# Patient Record
Sex: Male | Born: 1965 | Race: White | Hispanic: No | Marital: Married | Smoking: Never smoker
Health system: Southern US, Community
[De-identification: ages and names within clinical notes are randomized; demographics above are authoritative.]

## PROBLEM LIST (undated history)

## (undated) DIAGNOSIS — N189 Chronic kidney disease, unspecified: Secondary | ICD-10-CM

## (undated) DIAGNOSIS — K219 Gastro-esophageal reflux disease without esophagitis: Secondary | ICD-10-CM

## (undated) DIAGNOSIS — R7989 Other specified abnormal findings of blood chemistry: Secondary | ICD-10-CM

## (undated) DIAGNOSIS — R740 Nonspecific elevation of levels of transaminase and lactic acid dehydrogenase [LDH]: Secondary | ICD-10-CM

## (undated) DIAGNOSIS — G4733 Obstructive sleep apnea (adult) (pediatric): Secondary | ICD-10-CM

## (undated) DIAGNOSIS — E119 Type 2 diabetes mellitus without complications: Secondary | ICD-10-CM

## (undated) DIAGNOSIS — I10 Essential (primary) hypertension: Secondary | ICD-10-CM

## (undated) DIAGNOSIS — R0600 Dyspnea, unspecified: Secondary | ICD-10-CM

## (undated) DIAGNOSIS — R7401 Elevation of levels of liver transaminase levels: Secondary | ICD-10-CM

## (undated) DIAGNOSIS — E785 Hyperlipidemia, unspecified: Secondary | ICD-10-CM

## (undated) DIAGNOSIS — R12 Heartburn: Secondary | ICD-10-CM

## (undated) DIAGNOSIS — R7402 Elevation of levels of lactic acid dehydrogenase (LDH): Secondary | ICD-10-CM

## (undated) DIAGNOSIS — R0609 Other forms of dyspnea: Secondary | ICD-10-CM

## (undated) DIAGNOSIS — R7301 Impaired fasting glucose: Secondary | ICD-10-CM

## (undated) HISTORY — DX: Elevation of levels of liver transaminase levels: R74.01

## (undated) HISTORY — DX: Other specified abnormal findings of blood chemistry: R79.89

## (undated) HISTORY — DX: Nonspecific elevation of levels of transaminase and lactic acid dehydrogenase (ldh): R74.0

## (undated) HISTORY — DX: Impaired fasting glucose: R73.01

## (undated) HISTORY — DX: Essential (primary) hypertension: I10

## (undated) HISTORY — DX: Obstructive sleep apnea (adult) (pediatric): G47.33

## (undated) HISTORY — DX: Hyperlipidemia, unspecified: E78.5

## (undated) HISTORY — DX: Chronic kidney disease, unspecified: N18.9

## (undated) HISTORY — PX: OTHER SURGICAL HISTORY: SHX169

## (undated) HISTORY — DX: Elevation of levels of lactic acid dehydrogenase (LDH): R74.02

## (undated) HISTORY — DX: Heartburn: R12

---

## 2004-12-27 ENCOUNTER — Emergency Department: Payer: Self-pay | Admitting: Emergency Medicine

## 2006-08-29 IMAGING — CR DG ABDOMEN 1V
1 series · 1 of 1 positions shown · non-contrast
Comparison: none

REASON FOR EXAM: Abdominal pain, pt in rm 4
COMMENTS:

[view not recorded]
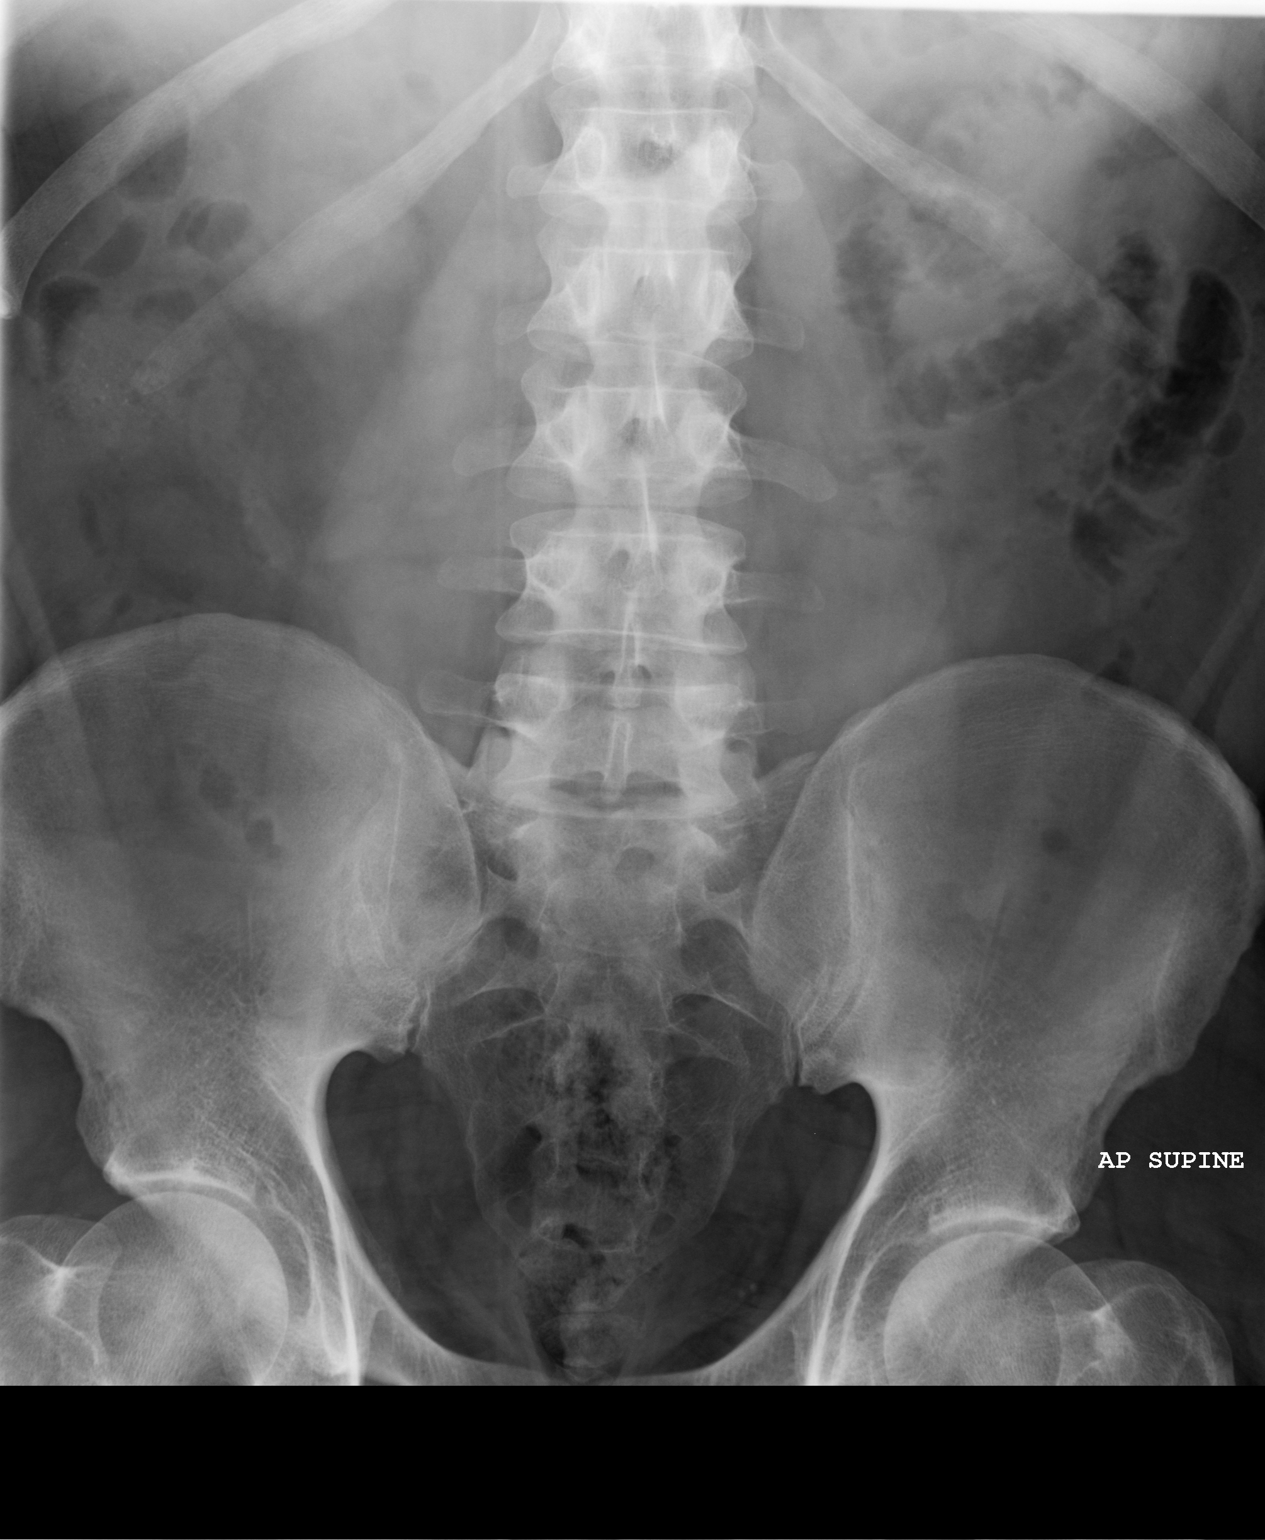

[1 of 1 positions shown; findings below may reference images not displayed]

PROCEDURE:     DXR - DXR KIDNEY URETER BLADDER  - December 27, 2004  [DATE]

RESULT:         AP view of the abdomen shows a normal bowel gas pattern.  No
abnormal intra-abdominal calcifications are seen.  A few tiny phleboliths
are noted in the pelvis.  The psoas margins are visualized bilaterally.  The
osseous structures are normal in appearance.
IMPRESSION: No acute changes are identified.

## 2010-06-20 ENCOUNTER — Emergency Department: Payer: Self-pay | Admitting: Unknown Physician Specialty

## 2012-01-17 HISTORY — DX: Rider (driver) (passenger) of other motorcycle injured in unspecified traffic accident, initial encounter: V29.99XA

## 2012-01-17 HISTORY — PX: OTHER SURGICAL HISTORY: SHX169

## 2012-07-17 DIAGNOSIS — S32000A Wedge compression fracture of unspecified lumbar vertebra, initial encounter for closed fracture: Secondary | ICD-10-CM | POA: Insufficient documentation

## 2012-07-17 DIAGNOSIS — S06309A Unspecified focal traumatic brain injury with loss of consciousness of unspecified duration, initial encounter: Secondary | ICD-10-CM | POA: Insufficient documentation

## 2012-07-18 DIAGNOSIS — J9811 Atelectasis: Secondary | ICD-10-CM | POA: Insufficient documentation

## 2012-07-18 DIAGNOSIS — S42102A Fracture of unspecified part of scapula, left shoulder, initial encounter for closed fracture: Secondary | ICD-10-CM | POA: Insufficient documentation

## 2012-07-18 DIAGNOSIS — T797XXA Traumatic subcutaneous emphysema, initial encounter: Secondary | ICD-10-CM | POA: Insufficient documentation

## 2012-07-18 DIAGNOSIS — E872 Acidosis: Secondary | ICD-10-CM | POA: Insufficient documentation

## 2012-07-18 DIAGNOSIS — J95821 Acute postprocedural respiratory failure: Secondary | ICD-10-CM | POA: Insufficient documentation

## 2012-07-18 DIAGNOSIS — S27322A Contusion of lung, bilateral, initial encounter: Secondary | ICD-10-CM | POA: Insufficient documentation

## 2012-07-18 DIAGNOSIS — S2249XA Multiple fractures of ribs, unspecified side, initial encounter for closed fracture: Secondary | ICD-10-CM | POA: Insufficient documentation

## 2012-07-23 DIAGNOSIS — S062X9A Diffuse traumatic brain injury with loss of consciousness of unspecified duration, initial encounter: Secondary | ICD-10-CM | POA: Insufficient documentation

## 2012-07-29 DIAGNOSIS — J189 Pneumonia, unspecified organism: Secondary | ICD-10-CM | POA: Insufficient documentation

## 2012-07-29 DIAGNOSIS — N390 Urinary tract infection, site not specified: Secondary | ICD-10-CM | POA: Insufficient documentation

## 2013-06-18 DIAGNOSIS — K219 Gastro-esophageal reflux disease without esophagitis: Secondary | ICD-10-CM | POA: Insufficient documentation

## 2013-06-19 DIAGNOSIS — I493 Ventricular premature depolarization: Secondary | ICD-10-CM | POA: Insufficient documentation

## 2014-06-11 DIAGNOSIS — I10 Essential (primary) hypertension: Secondary | ICD-10-CM | POA: Insufficient documentation

## 2014-07-09 ENCOUNTER — Telehealth: Payer: Self-pay | Admitting: Unknown Physician Specialty

## 2014-07-09 NOTE — Telephone Encounter (Signed)
Patient has a different number in practice partner- [phone]. Did you try that one?

## 2014-07-09 NOTE — Telephone Encounter (Signed)
I attempted to call the pt to reschedule his appt and the number we have on file has been disconnected. I have mailed a letter to the patient to call and reschedule. Thanks.

## 2014-07-23 DIAGNOSIS — I129 Hypertensive chronic kidney disease with stage 1 through stage 4 chronic kidney disease, or unspecified chronic kidney disease: Secondary | ICD-10-CM | POA: Insufficient documentation

## 2014-07-23 DIAGNOSIS — E785 Hyperlipidemia, unspecified: Secondary | ICD-10-CM | POA: Insufficient documentation

## 2014-07-23 DIAGNOSIS — Z713 Dietary counseling and surveillance: Secondary | ICD-10-CM | POA: Insufficient documentation

## 2014-07-23 DIAGNOSIS — E669 Obesity, unspecified: Secondary | ICD-10-CM

## 2014-07-23 DIAGNOSIS — R740 Nonspecific elevation of levels of transaminase and lactic acid dehydrogenase [LDH]: Secondary | ICD-10-CM

## 2014-07-23 DIAGNOSIS — R7402 Elevation of levels of lactic acid dehydrogenase (LDH): Secondary | ICD-10-CM | POA: Insufficient documentation

## 2014-07-23 DIAGNOSIS — M779 Enthesopathy, unspecified: Secondary | ICD-10-CM | POA: Insufficient documentation

## 2014-07-23 DIAGNOSIS — R7301 Impaired fasting glucose: Secondary | ICD-10-CM

## 2014-07-23 DIAGNOSIS — R7401 Elevation of levels of liver transaminase levels: Secondary | ICD-10-CM | POA: Insufficient documentation

## 2014-07-23 DIAGNOSIS — N182 Chronic kidney disease, stage 2 (mild): Secondary | ICD-10-CM | POA: Insufficient documentation

## 2014-07-23 DIAGNOSIS — I10 Essential (primary) hypertension: Secondary | ICD-10-CM | POA: Insufficient documentation

## 2014-07-23 DIAGNOSIS — G4733 Obstructive sleep apnea (adult) (pediatric): Secondary | ICD-10-CM | POA: Insufficient documentation

## 2014-07-24 ENCOUNTER — Ambulatory Visit: Payer: Self-pay | Admitting: Unknown Physician Specialty

## 2014-08-03 ENCOUNTER — Encounter: Payer: Self-pay | Admitting: Unknown Physician Specialty

## 2014-08-03 ENCOUNTER — Ambulatory Visit (INDEPENDENT_AMBULATORY_CARE_PROVIDER_SITE_OTHER): Payer: 59 | Admitting: Unknown Physician Specialty

## 2014-08-03 VITALS — BP 140/80 | HR 98 | Temp 99.0°F | Ht 70.0 in | Wt 280.0 lb

## 2014-08-03 DIAGNOSIS — I10 Essential (primary) hypertension: Secondary | ICD-10-CM | POA: Diagnosis not present

## 2014-08-03 MED ORDER — BENAZEPRIL HCL 10 MG PO TABS: 10.0000 mg | ORAL_TABLET | Freq: Every day | ORAL | Status: DC

## 2014-08-03 NOTE — Progress Notes (Signed)
   BP 140/80 mmHg  Pulse 98  Temp(Src) 99 F (37.2 C)  Ht 5\' 10"  (1.778 m)  Wt 280 lb (127.007 kg)  BMI 40.18 kg/m2  SpO2 99%   Subjective:    Patient ID: Andrew Mata, male    DOB: February 09, 1965, 49 y.o.   MRN: [id number]  HPI: Andrew Mata is a 49 y.o. male  Chief Complaint  Patient presents with  . Hypertension   Hypertension This is a chronic problem. Progression since onset: stable. The problem is controlled. Pertinent negatives include no anxiety, chest pain, palpitations, shortness of breath or sweats. There are no compliance problems.  There is no history of angina.    Relevant past medical, surgical, family and social history reviewed and updated as indicated. Interim medical history since our last visit reviewed. Allergies and medications reviewed and updated.  Review of Systems  Respiratory: Negative for shortness of breath.   Cardiovascular: Negative for chest pain and palpitations.    Per HPI unless specifically indicated above     Objective:    BP 140/80 mmHg  Pulse 98  Temp(Src) 99 F (37.2 C)  Ht 5\' 10"  (1.778 m)  Wt 280 lb (127.007 kg)  BMI 40.18 kg/m2  SpO2 99%  Wt Readings from Last 3 Encounters:  08/03/14 280 lb (127.007 kg)  01/23/14 271 lb (122.925 kg)    Second BP reading 140/80  Physical Exam  Constitutional: He is oriented to person, place, and time. He appears well-developed and well-nourished. No distress.  HENT:  Head: Normocephalic and atraumatic.  Eyes: Conjunctivae and lids are normal. Right eye exhibits no discharge. Left eye exhibits no discharge. No scleral icterus.  Cardiovascular: Normal rate, regular rhythm and normal heart sounds.   Pulmonary/Chest: Effort normal and breath sounds normal. No respiratory distress.  Abdominal: Normal appearance. There is no splenomegaly or hepatomegaly.  Musculoskeletal: Normal range of motion.  Neurological: He is alert and oriented to person, place, and time.  Skin: Skin is intact. No  rash noted. No pallor.  Psychiatric: He has a normal mood and affect. His behavior is normal. Judgment and thought content normal.    No results found for this or any previous visit.    Assessment & Plan:   Problem List Items Addressed This Visit      Cardiovascular and Mediastinum   Hypertension - Primary    BP is stable.  Refill present medications.        Relevant Medications   benazepril (LOTENSIN) 10 MG tablet       Follow up plan: Return in about 6 months (around 02/03/2015) for for PE.

## 2014-08-03 NOTE — Assessment & Plan Note (Signed)
BP is stable.  Refill present medications.

## 2014-08-12 ENCOUNTER — Other Ambulatory Visit: Payer: Self-pay | Admitting: Unknown Physician Specialty

## 2014-08-12 MED ORDER — BENAZEPRIL HCL 10 MG PO TABS: 10.0000 mg | ORAL_TABLET | Freq: Every day | ORAL | Status: DC

## 2014-08-12 NOTE — Telephone Encounter (Signed)
E-fax came through for refill on: Rx: benazepril (LOTENSIN) 10 MG tablet Copy in basket.

## 2014-08-12 NOTE — Telephone Encounter (Signed)
This medication was just refilled for this patient on 08/03/14 because he was seen then. But the rx was sent to [facility] and he needed it to be sent to [facility] because his insurance requires him to do mail order. So please resend rx to [facility].

## 2014-11-07 ENCOUNTER — Ambulatory Visit
Admission: EM | Admit: 2014-11-07 | Discharge: 2014-11-07 | Disposition: A | Discharge status: Home / Self Care | Payer: 59 | Attending: Emergency Medicine | Admitting: Emergency Medicine

## 2014-11-07 ENCOUNTER — Ambulatory Visit (INDEPENDENT_AMBULATORY_CARE_PROVIDER_SITE_OTHER): Payer: 59

## 2014-11-07 DIAGNOSIS — S62309A Unspecified fracture of unspecified metacarpal bone, initial encounter for closed fracture: Secondary | ICD-10-CM | POA: Diagnosis not present

## 2014-11-07 MED ORDER — HYDROCODONE-ACETAMINOPHEN 5-325 MG PO TABS: 2.0000 | ORAL_TABLET | ORAL | Status: DC | PRN

## 2014-11-07 NOTE — ED Provider Notes (Signed)
HPI  SUBJECTIVE:  Andrew Mata is a left handed  49 y.o. male who presents with pain, swelling along the lateral aspect of his right hand after falling off a tractor, hitting it on the tractor one week ago. He reports shooting, intermittent pain, limitation of motion secondary to pain. No redness, numbness, tingling, other injury to the wrist, arm, elbow. He denies hitting his head or loss of consciousness. Symptoms are better with ice, blood pressure on his palm, Aleve. Worse with use of palpation. He has not tried anything else for this. Past medical history of hypertension. Patient denies history of chronic kidney disease No history of diabetes, acid reflux, osteoporosis, tobacco use.  Past Medical History  Diagnosis Date  . Hypertension   . Obstructive sleep apnea   . Hyperlipidemia   . Chronic kidney disease   . Nonspecific elevation of levels of transaminase or lactic acid dehydrogenase (LDH)   . IFG (impaired fasting glucose)     History reviewed. No pertinent past surgical history.  Family History  Problem Relation Age of Onset  . Hypertension Mother   . Lung disease Father   . Hypertension Brother   . Hypertension Maternal Grandfather   . Hypertension Brother     Social History  Substance Use Topics  . Smoking status: Never Smoker   . Smokeless tobacco: Former Systems developer    Types: Chew  . Alcohol Use: 0.0 oz/week    0 Standard drinks or equivalent per week     Comment: one or less per day    No current facility-administered medications for this encounter.  Current outpatient prescriptions:  .  benazepril (LOTENSIN) 10 MG tablet, Take 1 tablet (10 mg total) by mouth daily., Disp: 90 tablet, Rfl: 1 .  omeprazole (PRILOSEC) 10 MG capsule, Take 10 mg by mouth daily., Disp: , Rfl:  .  HYDROcodone-acetaminophen (NORCO/VICODIN) 5-325 MG tablet, Take 2 tablets by mouth every 4 (four) hours as needed for moderate pain., Disp: 20 tablet, Rfl: 0  No Known  Allergies   ROS  As noted in HPI.   Physical Exam  BP 156/95 mmHg  Pulse 84  Temp(Src) 98 F (36.7 C) (Oral)  Resp 18  Ht 5\' 11"  (1.803 m)  Wt 288 lb (130.636 kg)  BMI 40.19 kg/m2  SpO2 100%  Constitutional: Well developed, well nourished, no acute distress Eyes:  EOMI, conjunctiva normal bilaterally HENT: Normocephalic, atraumatic,mucus membranes moist Respiratory: Normal inspiratory effort Cardiovascular: Normal rate GI: nondistended skin: No rash, skin intact Musculoskeletal: Tenderness of the distal right fifth metacarpal, soft tissue swelling. Tenderness at the MCP joint. No tenderness along the proximal middle or distal phalanx. No other tenderness along all the other carpals and metacarpals and phalanges. Baseline Strength and Sensation to R hand with normal light touch intact for Pt, distal motor and sensation in median/radial/ulnar nerve distribution .  Skin intact. No bruising. Wrist WNL.  Neurologic: Alert & oriented x 3, no focal neuro deficits Psychiatric: Speech and behavior appropriate   ED Course   Medications - No data to display  Orders Placed This Encounter  Procedures  . DG Hand Complete Right    Standing Status: Standing     Number of Occurrences: 1     Standing Expiration Date:     Order Specific Question:  Reason for Exam (SYMPTOM  OR DIAGNOSIS REQUIRED)    Answer:  pain/swelling after fall  . Ambulatory referral to Hand Surgery    Referral Priority:  Urgent  Referral Type:  Surgical    Referral Reason:  Specialty Services Required    Requested Specialty:  Hand Surgery    Number of Visits Requested:  1  . Apply other splint    Standing Status: Standing     Number of Occurrences: 1     Standing Expiration Date:     Order Specific Question:  Laterality    Answer:  Right    Order Specific Question:  Splint type    Answer:  Ulnar Gutter    No results found for this or any previous visit (from the past 24 hour(s)). Dg Hand Complete  Right  11/07/2014  CLINICAL DATA:  Fall 1 week ago. Pain involving the small finger. Swelling. Initial encounter. EXAM: RIGHT HAND - COMPLETE 3+ VIEW COMPARISON:  None. FINDINGS: There is a predominantly transverse fracture through the distal shaft of the fifth metacarpal with mild radial displacement and palmar angulation and slight comminution. No dislocation is seen. There is overlying soft tissue swelling. No radiopaque foreign body or lytic or blastic osseous lesion. IMPRESSION: Mildly displaced and angulated fifth metacarpal fracture. Electronically Signed   By: Logan Bores M.D.   On: 11/07/2014 11:05    ED Clinical Impression  Metacarpal bone fracture, closed, initial encounter   ED Assessment/Plan  reviewed imaging independently. Transverse fracture through distal shaft fifth metacarpal with radial displacement, palmar angulation, slight comminution. No dislocation. Soft tissue swelling. See radiology report for details  Patient is neurovascularly intact. Placing an ulnar gutter splint. We'll refer to orthopedics or hand for follow-up within several days. Ordered f/u . Home with ice, elevation., Norco and DC NSAIDs.  Discussed imaging, MDM, plan and followup with patient . Discussed sn/sx that should prompt return to the UC or ED. Patient agrees with plan.   *This clinic note was created using Dragon dictation software. Therefore, there may be occasional mistakes despite careful proofreading.  ?   Melynda Ripple, MD 11/07/14 1200

## 2014-11-07 NOTE — Discharge Instructions (Signed)
Follow-up with orthopedics or one of the hand surgeons. Call their office to try to make an appointment. Tell them that you were referred from urgent care. I have also placed a referral. Continue the ice, elevation. Wear the splint to protect it and for comfort. Go to the ER for any change in color your fingers, numbness, if pain is not controlled with medications or other concerns

## 2014-12-22 ENCOUNTER — Other Ambulatory Visit: Payer: Self-pay | Admitting: Unknown Physician Specialty

## 2015-01-29 ENCOUNTER — Encounter: Payer: 59 | Admitting: Unknown Physician Specialty

## 2015-02-22 ENCOUNTER — Telehealth: Payer: Self-pay

## 2015-02-22 ENCOUNTER — Encounter: Payer: Self-pay | Admitting: Unknown Physician Specialty

## 2015-02-22 ENCOUNTER — Ambulatory Visit (INDEPENDENT_AMBULATORY_CARE_PROVIDER_SITE_OTHER): Payer: 59 | Admitting: Unknown Physician Specialty

## 2015-02-22 VITALS — BP 126/84 | HR 109 | Temp 98.3°F | Ht 68.7 in | Wt 287.6 lb

## 2015-02-22 DIAGNOSIS — E669 Obesity, unspecified: Secondary | ICD-10-CM | POA: Diagnosis not present

## 2015-02-22 DIAGNOSIS — R6882 Decreased libido: Secondary | ICD-10-CM | POA: Diagnosis not present

## 2015-02-22 DIAGNOSIS — Z0000 Encounter for general adult medical examination without abnormal findings: Secondary | ICD-10-CM

## 2015-02-22 DIAGNOSIS — I10 Essential (primary) hypertension: Secondary | ICD-10-CM | POA: Diagnosis not present

## 2015-02-22 NOTE — Assessment & Plan Note (Signed)
Discussed diet and exercise 

## 2015-02-22 NOTE — Assessment & Plan Note (Signed)
Stable, continue present medications.   

## 2015-02-22 NOTE — Telephone Encounter (Signed)
Patient is here for visit and stated he got his flu shot at [facility]. So I called them and they stated that the patient got his flu shot 10/24/14.

## 2015-02-22 NOTE — Progress Notes (Signed)
BP 126/84 mmHg  Pulse 109  Temp(Src) 98.3 F (36.8 C)  Ht 5' 8.7" (1.745 m)  Wt 287 lb 9.6 oz (130.455 kg)  BMI 42.84 kg/m2  SpO2 97%   Subjective:    Patient ID: Andrew Mata, male    DOB: Apr 19, 1965, 50 y.o.   MRN: [id number]  HPI: Andrew Mata is a 50 y.o. male  Chief Complaint  Patient presents with  . Annual Exam   Patient is interested in getting testosterone level checked due to lower sex drive. No erection issues.    Hypertension Using medications without difficulty Average home BPs: doesn't take   No problems or lightheadedness No chest pain with exertion or shortness of breath No Edema  GERD Controlled with current medication  Obesity Tries to go to the gym daily and makes it regular some weeks and not so regular other weeks.  Doesn't eat fried foods.  Dirinks diet drinks.    Relevant past medical, surgical, family and social history reviewed and updated as indicated. Interim medical history since our last visit reviewed. Allergies and medications reviewed and updated.  Review of Systems  Constitutional: Negative.   HENT: Negative.   Eyes: Negative.   Respiratory: Negative.   Cardiovascular: Negative.   Gastrointestinal: Negative.   Endocrine: Negative.   Genitourinary: Negative.   Skin: Negative.   Allergic/Immunologic: Negative.   Neurological: Negative.   Hematological: Negative.   Psychiatric/Behavioral: Negative.     Per HPI unless specifically indicated above     Objective:    BP 126/84 mmHg  Pulse 109  Temp(Src) 98.3 F (36.8 C)  Ht 5' 8.7" (1.745 m)  Wt 287 lb 9.6 oz (130.455 kg)  BMI 42.84 kg/m2  SpO2 97%  Wt Readings from Last 3 Encounters:  02/22/15 287 lb 9.6 oz (130.455 kg)  11/07/14 288 lb (130.636 kg)  08/03/14 280 lb (127.007 kg)    Physical Exam  Constitutional: He is oriented to person, place, and time. He appears well-developed and well-nourished.  HENT:  Head: Normocephalic.  Right Ear: Tympanic membrane,  external ear and ear canal normal.  Left Ear: Tympanic membrane, external ear and ear canal normal.  Mouth/Throat: Uvula is midline, oropharynx is clear and moist and mucous membranes are normal.  Eyes: Pupils are equal, round, and reactive to light.  Cardiovascular: Normal rate, regular rhythm and normal heart sounds.  Exam reveals no gallop and no friction rub.   No murmur heard. Pulmonary/Chest: Effort normal and breath sounds normal. No respiratory distress.  Abdominal: Soft. Bowel sounds are normal. He exhibits no distension. There is no tenderness.  Musculoskeletal: Normal range of motion.  Neurological: He is alert and oriented to person, place, and time. He has normal reflexes.  Skin: Skin is warm and dry.  Psychiatric: He has a normal mood and affect. His behavior is normal. Judgment and thought content normal.    No results found for this or any previous visit.    Assessment & Plan:   Problem List Items Addressed This Visit      Unprioritized   Hypertension    Stable, continue present medications.        Obesity    Discussed diet and exercise      Low libido   Relevant Orders   Testosterone, Free, Total, SHBG    Other Visit Diagnoses    Annual physical exam    -  Primary    Relevant Orders    CBC    Comprehensive metabolic  panel    HIV antibody    Lipid Panel w/o Chol/HDL Ratio    PSA        Follow up plan: Return in about 6 months (around 08/22/2015).

## 2015-02-25 ENCOUNTER — Other Ambulatory Visit: Payer: 59

## 2015-02-25 DIAGNOSIS — R6882 Decreased libido: Secondary | ICD-10-CM

## 2015-02-25 DIAGNOSIS — Z0000 Encounter for general adult medical examination without abnormal findings: Secondary | ICD-10-CM

## 2015-02-26 ENCOUNTER — Telehealth: Payer: Self-pay

## 2015-02-26 DIAGNOSIS — E291 Testicular hypofunction: Secondary | ICD-10-CM

## 2015-02-26 NOTE — Telephone Encounter (Signed)
Discussed with pt Testosterone levels.  We will refer to Urology for further management.  Discussed cholesterol but not in statin benefit group.

## 2015-02-26 NOTE — Telephone Encounter (Signed)
I did not call this patient. Malachy Mood did you?

## 2015-02-26 NOTE — Telephone Encounter (Signed)
-----   Message from Stark Klein sent at 02/26/2015  4:43 PM EST ----- Contact: [phone] Pt return call

## 2015-02-27 LAB — LIPID PANEL W/O CHOL/HDL RATIO
CHOLESTEROL TOTAL: 214 mg/dL — AB (ref 100–199)
HDL: 49 mg/dL (ref >39)
LDL CALC: 134 mg/dL — AB (ref 0–99)
TRIGLYCERIDES: 154 mg/dL — AB (ref 0–149)
VLDL CHOLESTEROL CAL: 31 mg/dL (ref 5–40)

## 2015-02-27 LAB — COMPREHENSIVE METABOLIC PANEL
ALT: 25 IU/L (ref 0–44)
AST: 19 IU/L (ref 0–40)
Albumin/Globulin Ratio: 1.5 (ref 1.1–2.5)
Albumin: 4.3 g/dL (ref 3.5–5.5)
Alkaline Phosphatase: 65 IU/L (ref 39–117)
BUN/Creatinine Ratio: 17 (ref 9–20)
BUN: 19 mg/dL (ref 6–24)
Bilirubin Total: 0.4 mg/dL (ref 0.0–1.2)
CALCIUM: 9.3 mg/dL (ref 8.7–10.2)
CO2: 25 mmol/L (ref 18–29)
CREATININE: 1.1 mg/dL (ref 0.76–1.27)
Chloride: 99 mmol/L (ref 96–106)
GFR calc Af Amer: 90 mL/min/{1.73_m2} (ref >59)
GFR, EST NON AFRICAN AMERICAN: 78 mL/min/{1.73_m2} (ref >59)
GLOBULIN, TOTAL: 2.8 g/dL (ref 1.5–4.5)
GLUCOSE: 117 mg/dL — AB (ref 65–99)
Potassium: 5.2 mmol/L (ref 3.5–5.2)
Sodium: 139 mmol/L (ref 134–144)
Total Protein: 7.1 g/dL (ref 6.0–8.5)

## 2015-02-27 LAB — HIV ANTIBODY (ROUTINE TESTING W REFLEX): HIV SCREEN 4TH GENERATION: NONREACTIVE

## 2015-02-27 LAB — CBC
HEMATOCRIT: 44.3 % (ref 37.5–51.0)
Hemoglobin: 14.5 g/dL (ref 12.6–17.7)
MCH: 28.4 pg (ref 26.6–33.0)
MCHC: 32.7 g/dL (ref 31.5–35.7)
MCV: 87 fL (ref 79–97)
PLATELETS: 215 10*3/uL (ref 150–379)
RBC: 5.1 x10E6/uL (ref 4.14–5.80)
RDW: 15 % (ref 12.3–15.4)
WBC: 5.8 10*3/uL (ref 3.4–10.8)

## 2015-02-27 LAB — TESTOSTERONE, FREE, TOTAL, SHBG
Sex Hormone Binding: 50.9 nmol/L (ref 19.3–76.4)
Testosterone, Free: 5 pg/mL — ABNORMAL LOW (ref 7.2–24.0)
Testosterone: 220 ng/dL — ABNORMAL LOW (ref 348–1197)

## 2015-02-27 LAB — PSA: Prostate Specific Ag, Serum: 0.5 ng/mL (ref 0.0–4.0)

## 2015-03-05 ENCOUNTER — Ambulatory Visit (INDEPENDENT_AMBULATORY_CARE_PROVIDER_SITE_OTHER): Payer: 59 | Admitting: Urology

## 2015-03-05 ENCOUNTER — Encounter: Payer: Self-pay | Admitting: Urology

## 2015-03-05 VITALS — BP 131/86 | HR 85 | Ht 69.0 in | Wt 293.8 lb

## 2015-03-05 DIAGNOSIS — N528 Other male erectile dysfunction: Secondary | ICD-10-CM | POA: Diagnosis not present

## 2015-03-05 DIAGNOSIS — N529 Male erectile dysfunction, unspecified: Secondary | ICD-10-CM

## 2015-03-05 DIAGNOSIS — N401 Enlarged prostate with lower urinary tract symptoms: Secondary | ICD-10-CM

## 2015-03-05 DIAGNOSIS — E291 Testicular hypofunction: Secondary | ICD-10-CM | POA: Diagnosis not present

## 2015-03-05 DIAGNOSIS — N138 Other obstructive and reflux uropathy: Secondary | ICD-10-CM | POA: Insufficient documentation

## 2015-03-05 DIAGNOSIS — E349 Endocrine disorder, unspecified: Secondary | ICD-10-CM | POA: Insufficient documentation

## 2015-03-05 NOTE — Progress Notes (Signed)
03/05/2015 11:59 AM   Azell Der 07/12/1965 [id number]  Referring provider: Kathrine Haddock, NP [place], [place] [address]  Chief Complaint  Patient presents with  . Hypogonadism    Referred by Kathrine Haddock    HPI: Patient is a 50 year old Caucasian male who was found to have a low testosterone value on 02/25/2015. He is referred to [place] by his primary care provider, Kathrine Haddock, NP, for further evaluation and management.  Patient stated that he noted his sex drive over the last 5-7 years has declined and this prompted him to ask for his testosterone to be checked.  His testosterone returned at  220 ng/dL on 02/25/2015.  Hypogonadism Patient is experiencing a decrease in libido, a lack of energy, a decrease in strength, a loss in height, a decreased enjoyment in life, sadness and/or grumpiness and a recent deterioration in an ability to play sports.  This is indicated by his responses to the ADAM questionnaire.       Androgen Deficiency in the Aging Male      03/05/15 1100       Androgen Deficiency in the Aging Male   Do you have a decrease in libido (sex drive) Yes     Do you have lack of energy Yes     Do you have a decrease in strength and/or endurance Yes     Have you lost height Yes     Have you noticed a decreased "enjoyment of life" Yes     Are you sad and/or grumpy Yes     Are your erections less strong No     Have you noticed a recent deterioration in your ability to play sports Yes     Are you falling asleep after dinner No     Has there been a recent deterioration in your work performance No        Patient does have sleep apnea and is not sleeping with his CPAP machine.  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    Erectile dysfunction His SHIM score is 25, which is no erectile dysfunction.   His libido is diminished.   His risk factors for ED are obesity, BPH, sleep apnea,  HTN, CKD and IFG.  He denies any painful erections or curvatures with his erections.       SHIM      03/05/15 1126       SHIM: Over the last 6 months:   How do you rate your confidence that you could get and keep an erection? Very High     When you had erections with sexual stimulation, how often were your erections hard enough for penetration (entering your partner)? Almost Always or Always     During sexual intercourse, how often were you able to maintain your erection after you had penetrated (entered) your partner? Not Difficult     During sexual intercourse, how difficult was it to maintain your erection to completion of intercourse? Not Difficult     When you attempted sexual intercourse, how often was it satisfactory for you? Not Difficult     SHIM Total Score   SHIM 25        Score: 1-7 Severe ED 8-11 Moderate ED 12-16 Mild-Moderate ED 17-21 Mild ED 22-25 No ED   BPH WITH LUTS His IPSS score today is 1, which is mild lower urinary  tract symptomatology. He is delighted with his quality life due to his urinary symptoms. He denies any dysuria, hematuria or suprapubic pain.  He also denies any recent fevers, chills, nausea or vomiting.  He does not have a family history of PCa.      IPSS      03/05/15 1100       International Prostate Symptom Score   How often have you had the sensation of not emptying your bladder? Not at All     How often have you had to urinate less than every two hours? Not at All     How often have you found you stopped and started again several times when you urinated? Not at All     How often have you found it difficult to postpone urination? Not at All     How often have you had a weak urinary stream? Not at All     How often have you had to strain to start urination? Not at All     How many times did you typically get up at night to urinate? 1 Time     Total IPSS Score 1     Quality of Life due to urinary symptoms   If you were to spend the  rest of your life with your urinary condition just the way it is now how would you feel about that? Delighted        Score:  1-7 Mild 8-19 Moderate 20-35 Severe   PMH: Past Medical History  Diagnosis Date  . Hypertension   . Obstructive sleep apnea   . Hyperlipidemia   . Chronic kidney disease   . Nonspecific elevation of levels of transaminase or lactic acid dehydrogenase (LDH)   . IFG (impaired fasting glucose)   . Heart burn   . Low testosterone     Surgical History: Past Surgical History  Procedure Laterality Date  . Tracheotomy    . Lung collapse      Home Medications:    Medication List       This list is accurate as of: 03/05/15 11:59 AM.  Always use your most recent med list.               benazepril 10 MG tablet  Commonly known as:  LOTENSIN  Take 1 tablet (10 mg total) by mouth daily.     omeprazole 10 MG capsule  Commonly known as:  PRILOSEC  Take 10 mg by mouth daily.        Allergies: No Known Allergies  Family History: Family History  Problem Relation Age of Onset  . Hypertension Mother   . Lung disease Father   . Hypertension Brother   . Hypertension Maternal Grandfather   . Hypertension Brother   . Kidney cancer Mother   . Prostate cancer Paternal Grandfather   . Lung cancer Mother     Social History:  reports that he has never smoked. He has quit using smokeless tobacco. His smokeless tobacco use included Chew. He reports that he drinks alcohol. He reports that he does not use illicit drugs.  ROS: UROLOGY Frequent Urination?: No Hard to postpone urination?: No Burning/pain with urination?: No Get up at night to urinate?: Yes Leakage of urine?: No Urine stream starts and stops?: No Trouble starting stream?: No Do you have to strain to urinate?: No Blood in urine?: No Urinary tract infection?: No Sexually transmitted disease?: No Injury to kidneys or bladder?: No Painful intercourse?: No Weak  stream?: No Erection  problems?: No Penile pain?: No  Gastrointestinal Nausea?: No Vomiting?: No Indigestion/heartburn?: No Diarrhea?: No Constipation?: No  Constitutional Fever: No Night sweats?: No Weight loss?: No Fatigue?: Yes  Skin Skin rash/lesions?: No Itching?: No  Eyes Blurred vision?: No Double vision?: No  Ears/Nose/Throat Sore throat?: No Sinus problems?: No  Hematologic/Lymphatic Swollen glands?: No Easy bruising?: No  Cardiovascular Leg swelling?: No Chest pain?: No  Respiratory Cough?: No Shortness of breath?: Yes  Endocrine Excessive thirst?: No  Musculoskeletal Back pain?: Yes Joint pain?: Yes  Neurological Headaches?: No Dizziness?: Yes  Psychologic Depression?: Yes Anxiety?: Yes  Physical Exam: BP 131/86 mmHg  Pulse 85  Ht 5\' 9"  (1.753 m)  Wt 293 lb 12.8 oz (133.267 kg)  BMI 43.37 kg/m2  Constitutional: Well nourished. Alert and oriented, No acute distress. HEENT: [place] AT, moist mucus membranes. Trachea midline, no masses. Cardiovascular: No clubbing, cyanosis, or edema. Respiratory: Normal respiratory effort, no increased work of breathing. GI: Abdomen is soft, non tender, non distended, no abdominal masses. Liver and spleen not palpable.  No hernias appreciated.  Stool sample for occult testing is not indicated.   GU: No CVA tenderness.  No bladder fullness or masses.  Patient with circumcised phallus.   Urethral meatus is patent.  No penile discharge. No penile lesions or rashes. Scrotum without lesions, cysts, rashes and/or edema.  Testicles are located scrotally bilaterally. No masses are appreciated in the testicles. Left and right epididymis are normal. Rectal: Patient with  normal sphincter tone. Anus and perineum without scarring or rashes. No rectal masses are appreciated. Prostate is approximately 45 grams, no nodules are appreciated. Seminal vesicles are normal. Skin: No rashes, bruises or suspicious lesions. Lymph: No cervical or inguinal  adenopathy. Neurologic: Grossly intact, no focal deficits, moving all 4 extremities. Psychiatric: Normal mood and affect.  Laboratory Data: Lab Results  Component Value Date   WBC 5.8 02/25/2015   HCT 44.3 02/25/2015   MCV 87 02/25/2015   PLT 215 02/25/2015    Lab Results  Component Value Date   CREATININE 1.10 02/25/2015    PSA History  0.5 ng/mL on 02/25/2015  Lab Results  Component Value Date   TESTOSTERONE 220* 02/25/2015       Component Value Date/Time   CHOL 214* 02/25/2015 0809   HDL 49 02/25/2015 0809   LDLCALC 134* 02/25/2015 0809    Lab Results  Component Value Date   AST 19 02/25/2015   Lab Results  Component Value Date   ALT 25 02/25/2015      Assessment & Plan:    1. Hypogonadism:   I explained to patient that hypogonadism is diagnosed after 2 morning serum testosterone draws before 9 AM three days apart returned below the laboratories parameter for normal testosterone. He has completed his first blood draw.  I discussed with the patient the side effects of testosterone therapy, such as: enlargement of the prostate gland that may in turn cause LUTS, possible increased risk of PCa, DVT's and/or PE's, possible increased risk of heart attack or stroke, lower sperm count, swelling of the ankles, feet, or body, with or without heart failure, enlarged or painful breasts, have problems breathing while you sleep (sleep apnea), increased prostate specific antigen, mood swings, hypertension and increased red blood cell count.  I also discussed that some men have had success using clomid for hypogonadism.  It does seem to be more successful in younger men, but there are incidences of good results in middle-aged men.  I explained that it is used in male infertility to stimulate the testicles to make more testosterone/sperm.  There has been no long term data on side effects, but some urologists has been having success with this medication.   He is very interested in  Clomid.  His LFT's are within normal parameters.  He will return next week before 9 AM for his second serum testosterone draw. I will also add a TSH, prolactin, FSH/LH and an estradiol level.  If his second testosterone returns low, we will start Clomid 50 mg, 1/2 tablet daily, #30.  He will then recheck his testosterone level before 9AM in one month.    2. BPH with LUTS:   IPSS score is 1/0.  We will continue to monitor.  He will have his IPSS score, exam and PSA repeated in 6 months.    3. Erectile dysfunction:   SHIM score is 25.  His sexual function issues are most likely due to his lack of libido.  We will reassess when he has had his testosterone levels returned to normal.    Return for next week for morning labs.  These notes generated with voice recognition software. I apologize for typographical errors.  Zara Council, [facility] [address] [place], [place] [address] [phone]

## 2015-03-08 ENCOUNTER — Other Ambulatory Visit: Payer: 59

## 2015-03-08 DIAGNOSIS — E291 Testicular hypofunction: Secondary | ICD-10-CM

## 2015-03-09 ENCOUNTER — Other Ambulatory Visit: Payer: Self-pay | Admitting: Urology

## 2015-03-09 DIAGNOSIS — E291 Testicular hypofunction: Secondary | ICD-10-CM

## 2015-03-09 LAB — PROLACTIN: Prolactin: 15.6 ng/mL — ABNORMAL HIGH (ref 4.0–15.2)

## 2015-03-09 LAB — TESTOSTERONE: TESTOSTERONE: 212 ng/dL — AB (ref 348–1197)

## 2015-03-09 LAB — ESTRADIOL: Estradiol: 23.1 pg/mL (ref 7.6–42.6)

## 2015-03-09 LAB — TSH: TSH: 2.97 u[IU]/mL (ref 0.450–4.500)

## 2015-03-09 LAB — FSH/LH
FSH: 21.3 m[IU]/mL — ABNORMAL HIGH (ref 1.5–12.4)
LH: 15.6 m[IU]/mL — ABNORMAL HIGH (ref 1.7–8.6)

## 2015-03-09 MED ORDER — CLOMIPHENE CITRATE 50 MG PO TABS: ORAL_TABLET | ORAL | Status: DC

## 2015-03-10 ENCOUNTER — Telehealth: Payer: Self-pay

## 2015-03-10 DIAGNOSIS — E291 Testicular hypofunction: Secondary | ICD-10-CM

## 2015-03-10 NOTE — Telephone Encounter (Signed)
-----   Message from Nori Riis, PA-C sent at 03/09/2015  1:01 PM EST ----- Second testosterone is low.  I will send in the prescription for Clomid to his pharmacy.  He will need to return in 1 month for a morning testosterone level and a repeated LH/FSH and prolactin before 9 AM.

## 2015-03-10 NOTE — Telephone Encounter (Signed)
Spoke with pt in reference to testosterone. Made aware Clomid is at pharmacy and labs were needed in a month. Lab appt made. Orders placed.

## 2015-04-13 ENCOUNTER — Other Ambulatory Visit: Payer: 59

## 2015-04-13 DIAGNOSIS — E291 Testicular hypofunction: Secondary | ICD-10-CM

## 2015-04-14 ENCOUNTER — Telehealth: Payer: Self-pay

## 2015-04-14 DIAGNOSIS — E291 Testicular hypofunction: Secondary | ICD-10-CM

## 2015-04-14 LAB — TESTOSTERONE: TESTOSTERONE: 453 ng/dL (ref 348–1197)

## 2015-04-14 LAB — FSH/LH
FSH: 31.8 m[IU]/mL — ABNORMAL HIGH (ref 1.5–12.4)
LH: 24.4 m[IU]/mL — ABNORMAL HIGH (ref 1.7–8.6)

## 2015-04-14 LAB — PROLACTIN: PROLACTIN: 12.9 ng/mL (ref 4.0–15.2)

## 2015-04-14 NOTE — Telephone Encounter (Signed)
-----   Message from Nori Riis, PA-C sent at 04/14/2015  8:17 AM EDT ----- His testosterone level is normal.  He will need a HCT and testosterone in one month.

## 2015-04-14 NOTE — Telephone Encounter (Signed)
LMOM

## 2015-04-15 NOTE — Telephone Encounter (Signed)
Spoke with pt in reference to lab results and needing future labs. Lab appt was made. Orders placed.

## 2015-05-17 ENCOUNTER — Other Ambulatory Visit: Payer: 59

## 2015-05-17 DIAGNOSIS — E291 Testicular hypofunction: Secondary | ICD-10-CM

## 2015-05-18 LAB — TESTOSTERONE: Testosterone: 428 ng/dL (ref 348–1197)

## 2015-05-18 LAB — HEMATOCRIT: HEMATOCRIT: 44.6 % (ref 37.5–51.0)

## 2015-05-20 ENCOUNTER — Telehealth: Payer: Self-pay

## 2015-05-20 NOTE — Telephone Encounter (Signed)
LMOM- most recent labs are normal. Need to see again in August.

## 2015-05-20 NOTE — Telephone Encounter (Signed)
-----   Message from Nori Riis, PA-C sent at 05/18/2015  8:17 AM EDT ----- Patient's labs are normal.  I will need to see him in August for an ADAM, SHIM, IPSS, PSA, HCT and testosterone level.  The testosterone needs to be a morning drawn.

## 2015-06-08 ENCOUNTER — Encounter: Payer: Self-pay | Admitting: Unknown Physician Specialty

## 2015-06-08 ENCOUNTER — Ambulatory Visit (INDEPENDENT_AMBULATORY_CARE_PROVIDER_SITE_OTHER): Payer: 59 | Admitting: Unknown Physician Specialty

## 2015-06-08 VITALS — BP 132/86 | HR 101 | Temp 98.2°F | Ht 70.8 in | Wt 294.8 lb

## 2015-06-08 DIAGNOSIS — T148 Other injury of unspecified body region: Secondary | ICD-10-CM

## 2015-06-08 DIAGNOSIS — R05 Cough: Secondary | ICD-10-CM | POA: Diagnosis not present

## 2015-06-08 DIAGNOSIS — W57XXXA Bitten or stung by nonvenomous insect and other nonvenomous arthropods, initial encounter: Secondary | ICD-10-CM

## 2015-06-08 DIAGNOSIS — R059 Cough, unspecified: Secondary | ICD-10-CM

## 2015-06-08 MED ORDER — DOXYCYCLINE HYCLATE 100 MG PO TABS: 100.0000 mg | ORAL_TABLET | Freq: Two times a day (BID) | ORAL | Status: DC

## 2015-06-08 NOTE — Progress Notes (Signed)
   BP 132/86 mmHg  Pulse 101  Temp(Src) 98.2 F (36.8 C)  Ht 5' 10.8" (1.798 m)  Wt 294 lb 12.8 oz (133.72 kg)  BMI 41.36 kg/m2  SpO2 100%   Subjective:    Patient ID: Andrew Mata, male    DOB: March 02, 1965, 50 y.o.   MRN: [id number]  HPI: Andrew Mata is a 50 y.o. male  Chief Complaint  Patient presents with  . Generalized Body Aches    Pt states he was bit by a tick on his chest 2 weeks ago- states it was very read around the bite site and has been achy and had a cough ever since    Pt has had Lyme disease twice before.  2 weeks ago pulled off a tick but doesn't know how long it had been there.  He is aching, no fever or headache, and has a cough.    The site is erythematous.    Cough has been going on for the last 1 1/2 weeks.  No drainage, SOB, sore throat.    Relevant past medical, surgical, family and social history reviewed and updated as indicated. Interim medical history since our last visit reviewed. Allergies and medications reviewed and updated.  Review of Systems  Per HPI unless specifically indicated above     Objective:    BP 132/86 mmHg  Pulse 101  Temp(Src) 98.2 F (36.8 C)  Ht 5' 10.8" (1.798 m)  Wt 294 lb 12.8 oz (133.72 kg)  BMI 41.36 kg/m2  SpO2 100%  Wt Readings from Last 3 Encounters:  06/08/15 294 lb 12.8 oz (133.72 kg)  03/05/15 293 lb 12.8 oz (133.267 kg)  02/22/15 287 lb 9.6 oz (130.455 kg)    Physical Exam  Constitutional: He is oriented to person, place, and time. He appears well-developed and well-nourished. No distress.  HENT:  Head: Normocephalic and atraumatic.  Right Ear: Tympanic membrane, external ear and ear canal normal.  Left Ear: Tympanic membrane, external ear and ear canal normal.  Nose: Nose normal.  Mouth/Throat: Uvula is midline, oropharynx is clear and moist and mucous membranes are normal.  Eyes: Conjunctivae and lids are normal. Right eye exhibits no discharge. Left eye exhibits no discharge. No scleral icterus.   Neck: Normal range of motion. Neck supple. No JVD present. Carotid bruit is not present.  Cardiovascular: Normal rate, regular rhythm and normal heart sounds.   Pulmonary/Chest: Effort normal and breath sounds normal. No respiratory distress.  Abdominal: Normal appearance. There is no splenomegaly or hepatomegaly.  Musculoskeletal: Normal range of motion.  Neurological: He is alert and oriented to person, place, and time.  Skin: Skin is warm, dry and intact. No rash noted. No pallor.  erythematous area on chest at tick bite site.  No bulls eye rash  Psychiatric: He has a normal mood and affect. His behavior is normal. Judgment and thought content normal.      Assessment & Plan:   Problem List Items Addressed This Visit    None    Visit Diagnoses    Tick bite    -  Primary    Treat with Doxycycline 100 mg BID for 14 days due to symptoms    Cough            Follow up plan: Return if symptoms worsen or fail to improve.

## 2015-06-10 ENCOUNTER — Telehealth: Payer: Self-pay | Admitting: Unknown Physician Specialty

## 2015-06-10 MED ORDER — BENZONATATE 200 MG PO CAPS: 200.0000 mg | ORAL_CAPSULE | Freq: Two times a day (BID) | ORAL | Status: DC | PRN

## 2015-06-10 NOTE — Telephone Encounter (Signed)
Pharmacy: [id number] - [place], [place]  Patient stating that he is not doing better since he saw Malachy Mood this week. He wants to know if she can call him in something for his cough. Thanks.

## 2015-06-10 NOTE — Telephone Encounter (Signed)
I will call in [place] for his cough.  If it is not better, we will need to get a chest x-ray

## 2015-06-10 NOTE — Telephone Encounter (Signed)
Routing to provider  

## 2015-06-10 NOTE — Telephone Encounter (Signed)
Called and let patient know about rx. I asked for the patient to call us back if the cough does not get better so we can order the x-ray that [place] said.

## 2015-06-17 ENCOUNTER — Telehealth: Payer: Self-pay | Admitting: Unknown Physician Specialty

## 2015-06-17 NOTE — Telephone Encounter (Signed)
Pt added to Cheryl's schedule Monday at 2:45pm. Thanks Malachy Mood :)

## 2015-06-17 NOTE — Telephone Encounter (Signed)
If he is still having trouble with his cough, he needs to be seen.  OK with me to fit him in somewhere.   Looking ahead to Monday, it seems I only have 16 on the schedule.

## 2015-06-17 NOTE — Telephone Encounter (Signed)
Pt called stated he is still having issues with a cough, stated the medication is not helping. Would like to come in to see Malachy Mood early next week. There are no appointments available. Please advise. Thanks.

## 2015-06-21 ENCOUNTER — Ambulatory Visit: Payer: 59 | Admitting: Unknown Physician Specialty

## 2015-07-07 ENCOUNTER — Other Ambulatory Visit: Payer: Self-pay | Admitting: Unknown Physician Specialty

## 2015-11-02 ENCOUNTER — Other Ambulatory Visit: Payer: Self-pay | Admitting: Urology

## 2015-11-02 ENCOUNTER — Telehealth: Payer: Self-pay | Admitting: Urology

## 2015-11-02 DIAGNOSIS — N400 Enlarged prostate without lower urinary tract symptoms: Secondary | ICD-10-CM

## 2015-11-02 DIAGNOSIS — E291 Testicular hypofunction: Secondary | ICD-10-CM

## 2015-11-02 NOTE — Telephone Encounter (Signed)
Patient is requesting a refill on Clomid, but he is due for an office visit.  We can refill the medication until his appointment.  He will need morning testosterone (before 10AM), HCT, LFT's, and PSA prior to his appointment.

## 2015-11-03 MED ORDER — CLOMIPHENE CITRATE 50 MG PO TABS: ORAL_TABLET | ORAL | 0 refills | Status: DC

## 2015-11-03 NOTE — Telephone Encounter (Signed)
LMOM

## 2015-11-03 NOTE — Telephone Encounter (Signed)
Spoke with pt in reference needing an appt and labs. Pt voiced understanding. Pt was sent to the front to make f/u appt. 38mo clomid given.

## 2015-11-05 ENCOUNTER — Other Ambulatory Visit: Payer: 59

## 2015-11-05 DIAGNOSIS — N400 Enlarged prostate without lower urinary tract symptoms: Secondary | ICD-10-CM

## 2015-11-05 DIAGNOSIS — E291 Testicular hypofunction: Secondary | ICD-10-CM

## 2015-11-06 LAB — TESTOSTERONE: TESTOSTERONE: 295 ng/dL (ref 264–916)

## 2015-11-06 LAB — HEPATIC FUNCTION PANEL
ALBUMIN: 4.2 g/dL (ref 3.5–5.5)
ALT: 30 IU/L (ref 0–44)
AST: 28 IU/L (ref 0–40)
Alkaline Phosphatase: 54 IU/L (ref 39–117)
BILIRUBIN TOTAL: 0.4 mg/dL (ref 0.0–1.2)
Bilirubin, Direct: 0.13 mg/dL (ref 0.00–0.40)
Total Protein: 7.1 g/dL (ref 6.0–8.5)

## 2015-11-06 LAB — HEMATOCRIT: HEMATOCRIT: 46.2 % (ref 37.5–51.0)

## 2015-11-06 LAB — PSA: PROSTATE SPECIFIC AG, SERUM: 0.5 ng/mL (ref 0.0–4.0)

## 2015-11-09 ENCOUNTER — Encounter: Payer: Self-pay | Admitting: Urology

## 2015-11-09 ENCOUNTER — Ambulatory Visit: Payer: 59 | Admitting: Urology

## 2015-11-09 VITALS — BP 160/96 | HR 108 | Ht 71.0 in | Wt 295.8 lb

## 2015-11-09 DIAGNOSIS — N401 Enlarged prostate with lower urinary tract symptoms: Secondary | ICD-10-CM

## 2015-11-09 DIAGNOSIS — E291 Testicular hypofunction: Secondary | ICD-10-CM

## 2015-11-09 DIAGNOSIS — N138 Other obstructive and reflux uropathy: Secondary | ICD-10-CM

## 2015-11-09 DIAGNOSIS — N529 Male erectile dysfunction, unspecified: Secondary | ICD-10-CM | POA: Diagnosis not present

## 2015-11-09 MED ORDER — CLOMIPHENE CITRATE 50 MG PO TABS: ORAL_TABLET | ORAL | 3 refills | Status: DC

## 2015-11-09 NOTE — Progress Notes (Signed)
11/09/2015 3:18 PM   Andrew Mata [birth year] [id number]  Referring provider: Kathrine Haddock, NP [place] [place], [place] [address]  Chief Complaint  Patient presents with  . Hypogonadism    6 month follow up    HPI: Patient is a 50 year old Caucasian male with hypogonadism, BPH with LUTS and a history of erectile dysfunction presents today for a 6 months follow up.   Hypogonadism Patient is experiencing a decrease in strength and a recent deterioration in an ability to play sports.  This is indicated by his responses to the ADAM questionnaire.  He is still having spontaneous erections at night.  He has sleep apnea and is sleeping with a CPAP machine.  His most recent testosterone level was 295 ng/dL.   He is currently managing his hypogonadism with Clomid 50 mg, 1/2 daily.  He has been without the medication for the last several days.        Androgen Deficiency in the Aging Male    [place] Name 11/09/15 1400         Androgen Deficiency in the Aging Male   Do you have a decrease in libido (sex drive) No     Do you have lack of energy No     Do you have a decrease in strength and/or endurance Yes     Have you lost height No     Have you noticed a decreased "enjoyment of life" No     Are you sad and/or grumpy No     Are your erections less strong No     Have you noticed a recent deterioration in your ability to play sports Yes     Are you falling asleep after dinner No     Has there been a recent deterioration in your work performance No         Erectile dysfunction His SHIM score is 24, which is no erectile dysfunction.   His previous SHIM score was 25.  His libido is preserved.   His risk factors for ED are obesity, BPH, sleep apnea, HTN, CKD and IFG.  He denies any painful erections or curvatures with his erections.       SHIM    Row Name 11/09/15 1452         SHIM: Over the last 6 months:   How do you rate your confidence that you could get and keep an erection? High      When you had erections with sexual stimulation, how often were your erections hard enough for penetration (entering your partner)? Almost Always or Always     During sexual intercourse, how often were you able to maintain your erection after you had penetrated (entered) your partner? Not Difficult     During sexual intercourse, how difficult was it to maintain your erection to completion of intercourse? Not Difficult     When you attempted sexual intercourse, how often was it satisfactory for you? Not Difficult       SHIM Total Score   SHIM 24        Score: 1-7 Severe ED 8-11 Moderate ED 12-16 Mild-Moderate ED 17-21 Mild ED 22-25 No ED   BPH WITH LUTS His IPSS score today is 1, which is mild lower urinary tract symptomatology. He is delighted with his quality life due to his urinary symptoms.   His previous IPSS score was 1/0.   He denies any dysuria, hematuria or suprapubic pain.  He also denies any recent fevers,  chills, nausea or vomiting.  He does not have a family history of PCa.      IPSS    Row Name 11/09/15 1400         International Prostate Symptom Score   How often have you had the sensation of not emptying your bladder? Not at All     How often have you had to urinate less than every two hours? Not at All     How often have you found you stopped and started again several times when you urinated? Not at All     How often have you found it difficult to postpone urination? Not at All     How often have you had a weak urinary stream? Not at All     How often have you had to strain to start urination? Not at All     How many times did you typically get up at night to urinate? 1 Time     Total IPSS Score 1       Quality of Life due to urinary symptoms   If you were to spend the rest of your life with your urinary condition just the way it is now how would you feel about that? Delighted        Score:  1-7 Mild 8-19 Moderate 20-35 Severe   PMH: Past Medical  History:  Diagnosis Date  . Chronic kidney disease   . Heart burn   . Hyperlipidemia   . Hypertension   . IFG (impaired fasting glucose)   . Low testosterone   . Nonspecific elevation of levels of transaminase or lactic acid dehydrogenase (LDH)   . Obstructive sleep apnea     Surgical History: Past Surgical History:  Procedure Laterality Date  . lung collapse    . tracheotomy      Home Medications:    Medication List       Accurate as of 11/09/15  3:18 PM. Always use your most recent med list.          benazepril 10 MG tablet Commonly known as:  LOTENSIN Take 1 tablet (10 mg total) by mouth daily.   benzonatate 200 MG capsule Commonly known as:  TESSALON Take 1 capsule (200 mg total) by mouth 2 (two) times daily as needed for cough.   clomiPHENE 50 MG tablet Commonly known as:  CLOMID Take 1/2 tablet daily   doxycycline 100 MG tablet Commonly known as:  VIBRA-TABS Take 1 tablet (100 mg total) by mouth 2 (two) times daily.   MULTI-VITAMINS Tabs Take by mouth.   omeprazole 10 MG capsule Commonly known as:  PRILOSEC Take 10 mg by mouth daily.   pravastatin 40 MG tablet Commonly known as:  PRAVACHOL Take by mouth.       Allergies: No Known Allergies  Family History: Family History  Problem Relation Age of Onset  . Hypertension Mother   . Kidney cancer Mother   . Lung cancer Mother   . Lung disease Father   . Hypertension Brother   . Hypertension Maternal Grandfather   . Prostate cancer Paternal Grandfather   . Hypertension Brother   . Bladder Cancer Neg Hx     Social History:  reports that he has never smoked. He quit smokeless tobacco use about 30 years ago. His smokeless tobacco use included Chew. He reports that he drinks alcohol. He reports that he does not use drugs.  ROS: UROLOGY Frequent Urination?: No Hard to postpone urination?: No  Burning/pain with urination?: No Get up at night to urinate?: No Leakage of urine?: No Urine  stream starts and stops?: No Trouble starting stream?: No Do you have to strain to urinate?: No Blood in urine?: No Urinary tract infection?: No Sexually transmitted disease?: No Injury to kidneys or bladder?: No Painful intercourse?: No Weak stream?: No Erection problems?: No Penile pain?: No  Gastrointestinal Nausea?: No Vomiting?: No Indigestion/heartburn?: No Diarrhea?: No Constipation?: No  Constitutional Fever: No Night sweats?: No Weight loss?: No Fatigue?: No  Skin Skin rash/lesions?: No Itching?: No  Eyes Blurred vision?: No Double vision?: No  Ears/Nose/Throat Sore throat?: No Sinus problems?: No  Hematologic/Lymphatic Swollen glands?: No Easy bruising?: No  Cardiovascular Leg swelling?: No Chest pain?: No  Respiratory Cough?: No Shortness of breath?: No  Endocrine Excessive thirst?: No  Musculoskeletal Back pain?: No Joint pain?: No  Neurological Headaches?: No Dizziness?: No  Psychologic Depression?: No Anxiety?: No  Physical Exam: BP (!) 160/96   Pulse (!) 108   Ht 5\' 11"  (1.803 m)   Wt 295 lb 12.8 oz (134.2 kg)   BMI 41.26 kg/m   Constitutional: Well nourished. Alert and oriented, No acute distress. HEENT: [place] AT, moist mucus membranes. Trachea midline, no masses. Cardiovascular: No clubbing, cyanosis, or edema. Respiratory: Normal respiratory effort, no increased work of breathing. GI: Abdomen is soft, non tender, non distended, no abdominal masses. Liver and spleen not palpable.  No hernias appreciated.  Stool sample for occult testing is not indicated.   GU: No CVA tenderness.  No bladder fullness or masses.  Patient with circumcised phallus.   Urethral meatus is patent.  No penile discharge. No penile lesions or rashes. Scrotum without lesions, cysts, rashes and/or edema.  Testicles are located scrotally bilaterally. No masses are appreciated in the testicles. Left and right epididymis are normal. Rectal: Patient with   normal sphincter tone. Anus and perineum without scarring or rashes. No rectal masses are appreciated. Prostate is approximately 50 grams, no nodules are appreciated. Seminal vesicles are normal. Skin: No rashes, bruises or suspicious lesions. Lymph: No cervical or inguinal adenopathy. Neurologic: Grossly intact, no focal deficits, moving all 4 extremities. Psychiatric: Normal mood and affect.  Laboratory Data: Lab Results  Component Value Date   WBC 5.8 02/25/2015   HCT 46.2 11/05/2015   MCV 87 02/25/2015   PLT 215 02/25/2015    Lab Results  Component Value Date   CREATININE 1.10 02/25/2015    PSA History  0.5 ng/mL on 02/25/2015  0.5 ng/mL on 11/05/2015  Lab Results  Component Value Date   TESTOSTERONE 295 11/05/2015       Component Value Date/Time   CHOL 214 (H) 02/25/2015 0809   HDL 49 02/25/2015 0809   LDLCALC 134 (H) 02/25/2015 0809    Lab Results  Component Value Date   AST 28 11/05/2015   Lab Results  Component Value Date   ALT 30 11/05/2015      Assessment & Plan:    1. Hypogonadism:     -most recent testosterone level is 295 ng/dL on 11/05/2015  -continue Clomid 50 mg, 1/2 tablet daily; has been without the medication for one week- refills given  -RTC in 6 months for HCT, testosterone, PSA, LFT's, ADAM and exam  2. BPH with LUTS  - IPSS score is 1/0, it is stable  - Continue conservative management, avoiding bladder irritants and timed voiding's  - RTC in 6 months for IPSS, PSA and exam, as testosterone therapy can cause  prostate enlargement and worsen LUTS  3. Erectile dysfunction:     -SHIM score is 24  -RTC in 6 months for SHIM score and exam, as testosterone therapy can affect erections   Return in about 6 months (around 05/09/2016) for PSA, LFT's, HCT, testosterone (before 10 AM), ADAM, IPSS, SHIM and exam.  These notes generated with voice recognition software. I apologize for typographical errors.  Zara Council, [facility] [address] [place], [place] [address] [phone]

## 2016-05-01 ENCOUNTER — Other Ambulatory Visit: Payer: Self-pay

## 2016-05-01 DIAGNOSIS — E291 Testicular hypofunction: Secondary | ICD-10-CM

## 2016-05-02 ENCOUNTER — Other Ambulatory Visit: Payer: 59

## 2016-05-02 DIAGNOSIS — E291 Testicular hypofunction: Secondary | ICD-10-CM

## 2016-05-03 DIAGNOSIS — D122 Benign neoplasm of ascending colon: Secondary | ICD-10-CM | POA: Insufficient documentation

## 2016-05-03 LAB — HEPATIC FUNCTION PANEL
ALK PHOS: 61 IU/L (ref 39–117)
ALT: 29 IU/L (ref 0–44)
AST: 26 IU/L (ref 0–40)
Albumin: 4.1 g/dL (ref 3.5–5.5)
Bilirubin Total: 0.2 mg/dL (ref 0.0–1.2)
Bilirubin, Direct: 0.09 mg/dL (ref 0.00–0.40)
TOTAL PROTEIN: 6.7 g/dL (ref 6.0–8.5)

## 2016-05-03 LAB — TESTOSTERONE: TESTOSTERONE: 170 ng/dL — AB (ref 264–916)

## 2016-05-03 LAB — PSA: PROSTATE SPECIFIC AG, SERUM: 0.4 ng/mL (ref 0.0–4.0)

## 2016-05-03 LAB — HEMATOCRIT: Hematocrit: 43.8 % (ref 37.5–51.0)

## 2016-05-09 ENCOUNTER — Ambulatory Visit: Payer: 59 | Admitting: Urology

## 2016-05-12 NOTE — Progress Notes (Signed)
05/15/2016 3:10 PM   Andrew Mata [birth year] [id number]  Referring provider: Kathrine Haddock, NP [place], [place] [address]  Chief Complaint  Patient presents with  . Hypogonadism    6 month follow up     HPI: Patient is a 51 year old Caucasian male with testosterone deficiency, BPH with LUTS and a history of erectile dysfunction presents today for a 6 months follow up.   Testosterone deficiency Patient is experiencing a decrease in libido, a lack of energy, a decrease in strength, a decreased enjoyment in life and a recent deterioration in an ability to play sports.   This is indicated by his responses to the ADAM questionnaire.  He is no longer having spontaneous erections at night.  He has sleep apnea and is sleeping with a CPAP machine.  He is reporting a loss of body hair, reduced beard growth and obesity.  His most recent testosterone level was 170 ng/dL on 05/02/2016  He is currently managing his hypogonadism with Clomid 50 mg, 1/2 daily.  His LFT's and HCT are normal.       Androgen Deficiency in the Aging Male    [place] Name 05/15/16 1400         Androgen Deficiency in the Aging Male   Do you have a decrease in libido (sex drive) Yes     Do you have lack of energy Yes     Do you have a decrease in strength and/or endurance Yes     Have you lost height No     Have you noticed a decreased "enjoyment of life" Yes     Are you sad and/or grumpy No     Are your erections less strong No     Have you noticed a recent deterioration in your ability to play sports Yes     Are you falling asleep after dinner No     Has there been a recent deterioration in your work performance No          Erectile dysfunction His SHIM score is 23, which is erectile dysfunction.   His previous SHIM score was 24.  His libido is preserved.   His risk factors for ED are obesity, BPH, sleep apnea, HTN, CKD and IFG.  He denies any painful erections or curvatures with his erections.       SHIM     Row Name 05/15/16 1440         SHIM: Over the last 6 months:   How do you rate your confidence that you could get and keep an erection? Moderate     When you had erections with sexual stimulation, how often were your erections hard enough for penetration (entering your partner)? Almost Always or Always     During sexual intercourse, how often were you able to maintain your erection after you had penetrated (entered) your partner? Almost Always or Always     During sexual intercourse, how difficult was it to maintain your erection to completion of intercourse? Not Difficult     When you attempted sexual intercourse, how often was it satisfactory for you? Almost Always or Always       SHIM Total Score   SHIM 23        Score: 1-7 Severe ED 8-11 Moderate ED 12-16 Mild-Moderate ED 17-21 Mild ED 22-25 No ED   BPH WITH LUTS His IPSS score today is 2, which is mild lower urinary tract symptomatology. He is pleased with his quality  life due to his urinary symptoms.   His previous IPSS score was 1/0.   He denies any dysuria, hematuria or suprapubic pain.  He also denies any recent fevers, chills, nausea or vomiting.  He does not have a family history of PCa.      IPSS    Row Name 05/15/16 1400         International Prostate Symptom Score   How often have you had the sensation of not emptying your bladder? Not at All     How often have you had to urinate less than every two hours? Not at All     How often have you found you stopped and started again several times when you urinated? Not at All     How often have you found it difficult to postpone urination? Not at All     How often have you had a weak urinary stream? Not at All     How often have you had to strain to start urination? Not at All     How many times did you typically get up at night to urinate? 2 Times     Total IPSS Score 2       Quality of Life due to urinary symptoms   If you were to spend the rest of your life  with your urinary condition just the way it is now how would you feel about that? Pleased        Score:  1-7 Mild 8-19 Moderate 20-35 Severe   PMH: Past Medical History:  Diagnosis Date  . Chronic kidney disease   . Heart burn   . Hyperlipidemia   . Hypertension   . IFG (impaired fasting glucose)   . Low testosterone   . Nonspecific elevation of levels of transaminase or lactic acid dehydrogenase (LDH)   . Obstructive sleep apnea     Surgical History: Past Surgical History:  Procedure Laterality Date  . lung collapse    . tracheotomy      Home Medications:  Allergies as of 05/15/2016   No Known Allergies     Medication List       Accurate as of 05/15/16  3:10 PM. Always use your most recent med list.          benazepril 10 MG tablet Commonly known as:  LOTENSIN Take 1 tablet (10 mg total) by mouth daily.   benzonatate 200 MG capsule Commonly known as:  TESSALON Take 1 capsule (200 mg total) by mouth 2 (two) times daily as needed for cough.   clomiPHENE 50 MG tablet Commonly known as:  CLOMID Take 1/2 tablet daily   doxycycline 100 MG tablet Commonly known as:  VIBRA-TABS Take 1 tablet (100 mg total) by mouth 2 (two) times daily.   MULTI-VITAMINS Tabs Take by mouth.   omeprazole 10 MG capsule Commonly known as:  PRILOSEC Take 10 mg by mouth daily.   pravastatin 40 MG tablet Commonly known as:  PRAVACHOL Take by mouth.       Allergies: No Known Allergies  Family History: Family History  Problem Relation Age of Onset  . Hypertension Mother   . Kidney cancer Mother   . Lung cancer Mother   . Lung disease Father   . Hypertension Brother   . Hypertension Maternal Grandfather   . Prostate cancer Paternal Grandfather   . Hypertension Brother   . Bladder Cancer Neg Hx     Social History:  reports that he has  never smoked. He quit smokeless tobacco use about 31 years ago. His smokeless tobacco use included Chew. He reports that he drinks  alcohol. He reports that he does not use drugs.  ROS: UROLOGY Frequent Urination?: No Hard to postpone urination?: No Burning/pain with urination?: No Get up at night to urinate?: No Leakage of urine?: No Urine stream starts and stops?: No Trouble starting stream?: No Do you have to strain to urinate?: No Blood in urine?: No Urinary tract infection?: No Sexually transmitted disease?: No Injury to kidneys or bladder?: No Painful intercourse?: No Weak stream?: No Erection problems?: No Penile pain?: No  Gastrointestinal Nausea?: No Vomiting?: No Indigestion/heartburn?: No Diarrhea?: No Constipation?: No  Constitutional Fever: No Night sweats?: No Weight loss?: No Fatigue?: No  Skin Skin rash/lesions?: No Itching?: No  Eyes Blurred vision?: No Double vision?: No  Ears/Nose/Throat Sore throat?: No Sinus problems?: No  Hematologic/Lymphatic Swollen glands?: No Easy bruising?: No  Cardiovascular Leg swelling?: No Chest pain?: No  Respiratory Cough?: No Shortness of breath?: No  Endocrine Excessive thirst?: No  Musculoskeletal Back pain?: No Joint pain?: No  Neurological Headaches?: No Dizziness?: No  Psychologic Depression?: No Anxiety?: No  Physical Exam: BP 133/86   Pulse (!) 102   Ht 5\' 11"  (1.803 m)   Wt (!) 300 lb 1.6 oz (136.1 kg)   BMI 41.86 kg/m   Constitutional: Well nourished. Alert and oriented, No acute distress. HEENT: [place] AT, moist mucus membranes. Trachea midline, no masses. Cardiovascular: No clubbing, cyanosis, or edema. Respiratory: Normal respiratory effort, no increased work of breathing. GI: Abdomen is soft, non tender, non distended, no abdominal masses. Liver and spleen not palpable.  No hernias appreciated.  Stool sample for occult testing is not indicated.   GU: No CVA tenderness.  No bladder fullness or masses.  Patient with circumcised phallus.   Urethral meatus is patent.  No penile discharge. No penile  lesions or rashes. Scrotum without lesions, cysts, rashes and/or edema.  Testicles are located scrotally bilaterally. No masses are appreciated in the testicles. Left and right epididymis are normal. Rectal: Patient with  normal sphincter tone. Anus and perineum without scarring or rashes. No rectal masses are appreciated. Prostate is approximately 50 grams, no nodules are appreciated. Seminal vesicles are normal. Skin: No rashes, bruises or suspicious lesions. Lymph: No cervical or inguinal adenopathy. Neurologic: Grossly intact, no focal deficits, moving all 4 extremities. Psychiatric: Normal mood and affect.  Laboratory Data: Lab Results  Component Value Date   WBC 5.8 02/25/2015   HCT 43.8 05/02/2016   MCV 87 02/25/2015   PLT 215 02/25/2015    Lab Results  Component Value Date   CREATININE 1.10 02/25/2015    PSA History  0.5 ng/mL on 02/25/2015  0.5 ng/mL on 11/05/2015  0.4 ng/mL on 05/02/2016  Lab Results  Component Value Date   TESTOSTERONE 170 (L) 05/02/2016       Component Value Date/Time   CHOL 214 (H) 02/25/2015 0809   HDL 49 02/25/2015 0809   LDLCALC 134 (H) 02/25/2015 0809    Lab Results  Component Value Date   AST 26 05/02/2016   Lab Results  Component Value Date   ALT 29 05/02/2016      Assessment & Plan:    1. Testosterone deficiency    -most recent testosterone level is 170 ng/dL on 05/02/2016  -discontinue Clomid 50 mg, 1/2 tablet daily as he is not reaching therapeutic levels  - discussed gels, patches, pellets, buccal discs, nasal  spray, short and long acting injections  - patient expressed interest in the pellets  - he has filled out the insurance reimbursement forms for Testopel  - RTC pending reimbursement inquiry results  2. BPH with LUTS  - IPSS score is 2/1, it is worsening  - Continue conservative management, avoiding bladder irritants and timed voiding's  - RTC in 6 months for IPSS, PSA and exam, as testosterone therapy can  cause prostate enlargement and worsen LUTS  3. Erectile dysfunction:     -SHIM score is 23, it is worsening  -RTC in 6 months for SHIM score and exam, as testosterone therapy can affect erections   Return for pending testopel reimbursement.  These notes generated with voice recognition software. I apologize for typographical errors.  Zara Council, [facility] [address] [place], [place] [address] [phone]

## 2016-05-15 ENCOUNTER — Ambulatory Visit: Payer: 59 | Admitting: Urology

## 2016-05-15 ENCOUNTER — Encounter: Payer: Self-pay | Admitting: Urology

## 2016-05-15 VITALS — BP 133/86 | HR 102 | Ht 71.0 in | Wt 300.1 lb

## 2016-05-15 DIAGNOSIS — N401 Enlarged prostate with lower urinary tract symptoms: Secondary | ICD-10-CM

## 2016-05-15 DIAGNOSIS — N529 Male erectile dysfunction, unspecified: Secondary | ICD-10-CM

## 2016-05-15 DIAGNOSIS — E349 Endocrine disorder, unspecified: Secondary | ICD-10-CM | POA: Diagnosis not present

## 2016-05-15 DIAGNOSIS — N138 Other obstructive and reflux uropathy: Secondary | ICD-10-CM | POA: Diagnosis not present

## 2016-05-23 ENCOUNTER — Telehealth: Payer: Self-pay | Admitting: *Deleted

## 2016-05-23 NOTE — Telephone Encounter (Signed)
Spoke with patient regarding testopel approval. Patient transferred to [facility] to make appointment for Testopel. Patient ok with plan.

## 2016-06-07 ENCOUNTER — Other Ambulatory Visit: Payer: Self-pay | Admitting: Unknown Physician Specialty

## 2016-06-18 NOTE — Progress Notes (Signed)
06/19/2016 2:22 PM   Andrew Mata 1965-11-06 [id number]  Referring provider: Kathrine Haddock, NP [address], [place] [address]  Chief Complaint  Patient presents with  . Hypogonadism    Testopel    HPI: Patient is a 51 year old Caucasian male with testosterone deficiency, BPH with LUTS and a history of erectile dysfunction presents today for a Testopel insertion.    Testosterone deficiency Patient is experiencing a decrease in libido, a lack of energy, a decrease in strength, a decreased enjoyment in life and a recent deterioration in an ability to play sports.   This is indicated by his responses to the ADAM questionnaire.  He is no longer having spontaneous erections at night.  He has sleep apnea and is sleeping with a CPAP machine.  He is reporting a loss of body hair, reduced beard growth and obesity.  His most recent testosterone level was 170 ng/dL on 05/02/2016  He was managing his hypogonadism with Clomid 50 mg, 1/2 daily.  His LFT's and HCT are normal.  His testosterone levels were not therapeutic on Clomid, so a decision was made to switch treatment modalities and he presents today for a Testopel insertion.     Androgen Deficiency in the Aging Male    [place] Name 05/15/16 1400         Androgen Deficiency in the Aging Male   Do you have a decrease in libido (sex drive) Yes     Do you have lack of energy Yes     Do you have a decrease in strength and/or endurance Yes     Have you lost height No     Have you noticed a decreased "enjoyment of life" Yes     Are you sad and/or grumpy No     Are your erections less strong No     Have you noticed a recent deterioration in your ability to play sports Yes     Are you falling asleep after dinner No     Has there been a recent deterioration in your work performance No           PMH: Past Medical History:  Diagnosis Date  . Chronic kidney disease   . Heart burn   . Hyperlipidemia   . Hypertension   . IFG (impaired  fasting glucose)   . Low testosterone   . Nonspecific elevation of levels of transaminase or lactic acid dehydrogenase (LDH)   . Obstructive sleep apnea     Surgical History: Past Surgical History:  Procedure Laterality Date  . lung collapse    . tracheotomy      Home Medications:  Allergies as of 06/19/2016   No Known Allergies     Medication List       Accurate as of 06/19/16  2:22 PM. Always use your most recent med list.          benazepril 10 MG tablet Commonly known as:  LOTENSIN TAKE 1 TABLET BY MOUTH  DAILY   clomiPHENE 50 MG tablet Commonly known as:  CLOMID Take 1/2 tablet daily   doxycycline 100 MG tablet Commonly known as:  VIBRA-TABS Take 1 tablet (100 mg total) by mouth 2 (two) times daily.   MULTI-VITAMINS Tabs Take by mouth.   omeprazole 10 MG capsule Commonly known as:  PRILOSEC Take 10 mg by mouth daily.   pravastatin 40 MG tablet Commonly known as:  PRAVACHOL Take by mouth.       Allergies: No Known  Allergies  Family History: Family History  Problem Relation Age of Onset  . Hypertension Mother   . Kidney cancer Mother   . Lung cancer Mother   . Lung disease Father   . Hypertension Brother   . Hypertension Maternal Grandfather   . Prostate cancer Paternal Grandfather   . Hypertension Brother   . Bladder Cancer Neg Hx     Social History:  reports that he has never smoked. He quit smokeless tobacco use about 31 years ago. His smokeless tobacco use included Chew. He reports that he drinks alcohol. He reports that he does not use drugs.  ROS: UROLOGY Frequent Urination?: No Hard to postpone urination?: No Burning/pain with urination?: No Get up at night to urinate?: No Leakage of urine?: No Urine stream starts and stops?: No Trouble starting stream?: No Do you have to strain to urinate?: No Blood in urine?: No Urinary tract infection?: No Sexually transmitted disease?: No Injury to kidneys or bladder?: No Painful  intercourse?: No Weak stream?: No Erection problems?: No Penile pain?: No  Gastrointestinal Nausea?: No Vomiting?: No Indigestion/heartburn?: Yes Diarrhea?: No Constipation?: No  Constitutional Fever: No Night sweats?: No Weight loss?: No Fatigue?: No  Skin Skin rash/lesions?: No Itching?: No  Eyes Blurred vision?: No Double vision?: No  Ears/Nose/Throat Sore throat?: No Sinus problems?: No  Hematologic/Lymphatic Swollen glands?: No Easy bruising?: No  Cardiovascular Leg swelling?: No Chest pain?: No  Respiratory Cough?: No Shortness of breath?: Yes  Endocrine Excessive thirst?: No  Musculoskeletal Back pain?: Yes Joint pain?: No  Neurological Headaches?: No Dizziness?: Yes  Psychologic Depression?: No Anxiety?: No  Physical Exam: BP (!) 132/91   Pulse (!) 104   Ht 5\' 11"  (1.803 m)   Wt (!) 304 lb 1.6 oz (137.9 kg)   BMI 42.41 kg/m   Constitutional: Well nourished. Alert and oriented, No acute distress. HEENT: [place] AT, moist mucus membranes. Trachea midline, no masses. Cardiovascular: No clubbing, cyanosis, or edema. Respiratory: Normal respiratory effort, no increased work of breathing. Skin: No rashes, bruises or suspicious lesions. Lymph: No cervical or inguinal adenopathy. Neurologic: Grossly intact, no focal deficits, moving all 4 extremities. Psychiatric: Normal mood and affect.  Laboratory Data: Lab Results  Component Value Date   WBC 5.8 02/25/2015   HCT 43.8 05/02/2016   MCV 87 02/25/2015   PLT 215 02/25/2015    Lab Results  Component Value Date   CREATININE 1.10 02/25/2015    PSA History  0.5 ng/mL on 02/25/2015  0.5 ng/mL on 11/05/2015  0.4 ng/mL on 05/02/2016  Lab Results  Component Value Date   TESTOSTERONE 170 (L) 05/02/2016       Component Value Date/Time   CHOL 214 (H) 02/25/2015 0809   HDL 49 02/25/2015 0809   LDLCALC 134 (H) 02/25/2015 0809    Lab Results  Component Value Date   AST 26  05/02/2016   Lab Results  Component Value Date   ALT 29 05/02/2016   Procedure Patient is placed on the exam table in the left lateral jackknife position.  Identified upper outer quadrant of hip for insertion; prepped area with Betadine and injected 10 cc's of Lidocaine 1% with Epinephrine to anesthetize superficially and distally along trocar tract.  Made 3 mm incision using 15 blade of scalpel; trocar with sharp ended stylet was inserted into subcutaneous tissue in line with femur. Sharp stylet was withdrawn and 6 pellets were placed into trocar well. Testopel pellets advanced into tissue using blunt ended stylet. Trocar removed  and incision closed using 6 Steri-Strips. Cleansed area to remove Betadine and covered Steri-Strips with outer Band-Aid.  Careful inspection of insertion is done and patient informed of post procedure instructions.  He will return in three month for serum testosterone before 9:00am.   Assessment & Plan:    1. Testosterone deficiency    -most recent testosterone level is 170 ng/dL on 05/02/2016  -Testopel insertion today; 6 pellets  -RTC in one month for testosterone level    Return in about 1 month (around 07/19/2016) for testosterone only.  These notes generated with voice recognition software. I apologize for typographical errors.  Zara Council, [facility] [address] [place], [place] [address] [phone]

## 2016-06-19 ENCOUNTER — Ambulatory Visit (INDEPENDENT_AMBULATORY_CARE_PROVIDER_SITE_OTHER): Payer: 59 | Admitting: Urology

## 2016-06-19 ENCOUNTER — Encounter: Payer: Self-pay | Admitting: Urology

## 2016-06-19 VITALS — BP 132/91 | HR 104 | Ht 71.0 in | Wt 304.1 lb

## 2016-06-19 DIAGNOSIS — E349 Endocrine disorder, unspecified: Secondary | ICD-10-CM | POA: Diagnosis not present

## 2016-06-19 MED ORDER — TESTOSTERONE 75 MG IL PLLT
75.0000 mg | PELLET | Freq: Once | Status: AC
  Administered 2016-06-19: 75 mg

## 2016-07-24 ENCOUNTER — Other Ambulatory Visit: Payer: 59

## 2016-07-24 DIAGNOSIS — E349 Endocrine disorder, unspecified: Secondary | ICD-10-CM

## 2016-07-25 LAB — TESTOSTERONE: Testosterone: 421 ng/dL (ref 264–916)

## 2016-09-25 ENCOUNTER — Other Ambulatory Visit: Payer: 59

## 2016-09-25 DIAGNOSIS — E291 Testicular hypofunction: Secondary | ICD-10-CM

## 2016-09-26 ENCOUNTER — Telehealth: Payer: Self-pay | Admitting: Family Medicine

## 2016-09-26 LAB — TESTOSTERONE: Testosterone: 172 ng/dL — ABNORMAL LOW (ref 264–916)

## 2016-09-26 NOTE — Telephone Encounter (Signed)
Left message for patient to return call.

## 2016-09-26 NOTE — Telephone Encounter (Signed)
-----   Message from Nori Riis, PA-C sent at 09/26/2016  7:59 AM EDT ----- Please let Andrew Mata know that his testosterone is low.  I have also noticed that according to our records, he has gained weight.  This may contribute to the Testopel not being as effective.  Has he noticed a weight gain?

## 2016-09-29 NOTE — Telephone Encounter (Signed)
That is good to hear about the CPAP machine.  We do need to see him sometime this month for his PSA, HBG, HCT and office visit.

## 2016-09-29 NOTE — Telephone Encounter (Signed)
Spoke with patient and he does understand about calling PCP for referral to nutritional services. Patient states he does have sleep apnea but he has never slept with a CPAP machine. Patient does however have a 1:00 appointment today for them to come to his home and set up for him getting the CPAP machine.

## 2016-09-29 NOTE — Progress Notes (Signed)
10/02/2016 9:12 AM   Andrew Mata 10, 1967 [id number]  Referring provider: Kathrine Haddock, NP [address], [place] [address]  Chief Complaint  Patient presents with  . Hypogonadism    77month    HPI: Patient is a 51 year old Caucasian male with testosterone deficiency, BPH with LU TS and ED who presents today for a 6 month follow up.    Testosterone deficiency Patient is experiencing a decrease in libido, a lack of energy, a decrease in strength, a decreased enjoyment in life, erections being less strong, a recent deterioration in an ability to play sports and following asleep after dinner.  This is indicated by his responses to the ADAM questionnaire.  He is no longer having spontaneous erections at night.  He has sleep apnea and is not sleeping with a CPAP machine.  He is reporting a loss of body hair, reduced beard growth and obesity.  His most recent testosterone level was 172 ng/dL on 09/25/2016  He is currently managing his testosterone deficiency with Testopel, 6 pellets q 3 months.         Androgen Deficiency in the Aging Male    [place] Name 10/02/16 0900         Androgen Deficiency in the Aging Male   Do you have a decrease in libido (sex drive) Yes     Do you have lack of energy Yes     Do you have a decrease in strength and/or endurance Yes     Have you lost height No     Have you noticed a decreased "enjoyment of life" Yes     Are you sad and/or grumpy No     Are your erections less strong Yes     Have you noticed a recent deterioration in your ability to play sports Yes     Are you falling asleep after dinner Yes     Has there been a recent deterioration in your work performance No        BPH WITH LUTS  (prostate and/or bladder) His IPSS score today is 2, which is mild lower urinary tract symptomatology. He is pleased with his quality life due to his urinary symptoms.  His previous IPSS score was 2/0.   His major complaint today nocturia x 2.  He has had these  symptoms for a few years.  He denies any dysuria, hematuria or suprapubic pain.  He also denies any recent fevers, chills, nausea or vomiting.  He does not have a family history of PCa.     IPSS    Row Name 10/02/16 0900         International Prostate Symptom Score   How often have you had the sensation of not emptying your bladder? Not at All     How often have you had to urinate less than every two hours? Not at All     How often have you found you stopped and started again several times when you urinated? Not at All     How often have you found it difficult to postpone urination? Not at All     How often have you had a weak urinary stream? Not at All     How often have you had to strain to start urination? Not at All     How many times did you typically get up at night to urinate? 2 Times     Total IPSS Score 2       Quality  of Life due to urinary symptoms   If you were to spend the rest of your life with your urinary condition just the way it is now how would you feel about that? Pleased        Score:  1-7 Mild 8-19 Moderate Severe    Erectile dysfunction His SHIM score is 22, which is no ED.   His previous SHIM score was 23.  His libido is diminished.  His risk factors for ED are age, BPH, testosterone deficiency, HTN, CKD, and sleep apnea.  He denies any painful erections or curvatures with his erections.   He is no longer having spontaneous erections.       SHIM    Row Name 10/02/16 0902         SHIM: Over the last 6 months:   How do you rate your confidence that you could get and keep an erection? Moderate     When you had erections with sexual stimulation, how often were your erections hard enough for penetration (entering your partner)? Almost Always or Always     During sexual intercourse, how often were you able to maintain your erection after you had penetrated (entered) your partner? Almost Always or Always     During sexual intercourse, how difficult was it to  maintain your erection to completion of intercourse? Slightly Difficult     When you attempted sexual intercourse, how often was it satisfactory for you? Almost Always or Always       SHIM Total Score   SHIM 22        Score: 1-7 Severe ED 8-11 Moderate ED 12-16 Mild-Moderate ED 17-21 Mild ED 22-25 No ED      PMH: Past Medical History:  Diagnosis Date  . Chronic kidney disease   . Heart burn   . Hyperlipidemia   . Hypertension   . IFG (impaired fasting glucose)   . Low testosterone   . Nonspecific elevation of levels of transaminase or lactic acid dehydrogenase (LDH)   . Obstructive sleep apnea     Surgical History: Past Surgical History:  Procedure Laterality Date  . lung collapse    . tracheotomy      Home Medications:  Allergies as of 10/02/2016   No Known Allergies     Medication List       Accurate as of 10/02/16  9:12 AM. Always use your most recent med list.          benazepril 10 MG tablet Commonly known as:  LOTENSIN TAKE 1 TABLET BY MOUTH  DAILY   MULTI-VITAMINS Tabs Take by mouth.   omeprazole 10 MG capsule Commonly known as:  PRILOSEC Take 10 mg by mouth daily.   pravastatin 40 MG tablet Commonly known as:  PRAVACHOL Take by mouth.            Discharge Care Instructions        Start     Ordered   10/02/16 0000  PSA     10/02/16 0903   10/02/16 0000  Hematocrit     10/02/16 0903   10/02/16 0000  Hemoglobin     10/02/16 0903   10/02/16 0000  TSH     10/02/16 0911      Allergies: No Known Allergies  Family History: Family History  Problem Relation Age of Onset  . Hypertension Mother   . Kidney cancer Mother   . Lung cancer Mother   . Lung disease Father   . Hypertension  Brother   . Hypertension Maternal Grandfather   . Prostate cancer Paternal Grandfather   . Hypertension Brother   . Bladder Cancer Neg Hx     Social History:  reports that he has never smoked. He quit smokeless tobacco use about 31 years ago.  His smokeless tobacco use included Chew. He reports that he drinks alcohol. He reports that he does not use drugs.  ROS: UROLOGY Frequent Urination?: No Hard to postpone urination?: No Burning/pain with urination?: No Get up at night to urinate?: Yes Leakage of urine?: No Urine stream starts and stops?: No Trouble starting stream?: No Do you have to strain to urinate?: No Blood in urine?: No Urinary tract infection?: No Sexually transmitted disease?: No Injury to kidneys or bladder?: No Painful intercourse?: No Weak stream?: No Erection problems?: No Penile pain?: No  Gastrointestinal Nausea?: Yes Vomiting?: No Indigestion/heartburn?: No Diarrhea?: No Constipation?: No  Constitutional Fever: No Night sweats?: No Weight loss?: No Fatigue?: Yes  Skin Skin rash/lesions?: No Itching?: No  Eyes Blurred vision?: No Double vision?: No  Ears/Nose/Throat Sore throat?: No Sinus problems?: No  Hematologic/Lymphatic Swollen glands?: No Easy bruising?: No  Cardiovascular Leg swelling?: No Chest pain?: No  Respiratory Cough?: No Shortness of breath?: No  Endocrine Excessive thirst?: No  Musculoskeletal Back pain?: No Joint pain?: No  Neurological Headaches?: No Dizziness?: No  Psychologic Depression?: No Anxiety?: No  Physical Exam: BP (!) 173/71   Pulse (!) 105   Ht 5\' 10"  (1.778 m)   Wt (!) 305 lb (138.3 kg)   BMI 43.76 kg/m   Constitutional: Well nourished. Alert and oriented, No acute distress. HEENT: [place] AT, moist mucus membranes. Trachea midline, no masses. Cardiovascular: No clubbing, cyanosis, or edema. Respiratory: Normal respiratory effort, no increased work of breathing. GI: Abdomen is soft, non tender, non distended, no abdominal masses. Liver and spleen not palpable.  No hernias appreciated.  Stool sample for occult testing is not indicated.   GU: No CVA tenderness.  No bladder fullness or masses.  Patient with circumcised phallus.  Urethral meatus is patent.  No penile discharge. No penile lesions or rashes. Scrotum without lesions, cysts, rashes and/or edema.  Testicles are located scrotally bilaterally. No masses are appreciated in the testicles. Left and right epididymis are normal. Rectal: Patient with  normal sphincter tone. Anus and perineum without scarring or rashes. No rectal masses are appreciated. Prostate is approximately 45 grams, no nodules are appreciated. Seminal vesicles are normal. Skin: No rashes, bruises or suspicious lesions. Lymph: No cervical or inguinal adenopathy. Neurologic: Grossly intact, no focal deficits, moving all 4 extremities. Psychiatric: Normal mood and affect.  Laboratory Data: PSA History  0.5 ng/mL on 02/25/2015  0.5 ng/mL on 11/05/2015  0.4 ng/mL on 05/02/2016  Lab Results  Component Value Date   TESTOSTERONE 172 (L) 09/25/2016    Lab Results  Component Value Date   AST 26 05/02/2016   Lab Results  Component Value Date   ALT 29 05/02/2016   I have reviewed the labs   Assessment & Plan:    1. Testosterone deficiency    - most recent testosterone level is 172 ng/dL on 09/25/2016  - schedule Testopel  - HCT/HBG drawn today  2. BPH with LUTS  - IPSS score is 2/1, it is stable  - Continue conservative management, avoiding bladder irritants and timed voiding's  - RTC in 6 months for IPSS, PSA, PVR and exam, as testosterone therapy can cause prostate enlargement and worsen LU TS  -  PSA drawn today  3. Erectile dysfunction:     -SHIM score is 22  - encouraged him to sleep with CPAP machine as it improves ED  - encouraged him to exercise 4 times weekly 40 minutes at a time vigorously as this improves ED as well  - TSH is drawn today  -RTC in 6 months for SHIM score and exam, as testosterone therapy can affect erections   Return for schedule Testopel.  These notes generated with voice recognition software. I apologize for typographical errors.  Zara Council,  [facility] [address] [place], [place] [address] [phone]

## 2016-09-29 NOTE — Telephone Encounter (Signed)
I would have Andrew Mata contact his PCP regarding a referral to nutritional services and also to make sure that he does sleep with a CPAP machine nightly.

## 2016-09-29 NOTE — Telephone Encounter (Signed)
Spoke with patient and gave message about weight gain and Testopel not being as effective. Patient states he has noticed a weight gain.

## 2016-10-02 ENCOUNTER — Encounter: Payer: Self-pay | Admitting: Urology

## 2016-10-02 ENCOUNTER — Ambulatory Visit (INDEPENDENT_AMBULATORY_CARE_PROVIDER_SITE_OTHER): Payer: 59 | Admitting: Urology

## 2016-10-02 VITALS — BP 173/71 | HR 105 | Ht 70.0 in | Wt 305.0 lb

## 2016-10-02 DIAGNOSIS — N529 Male erectile dysfunction, unspecified: Secondary | ICD-10-CM

## 2016-10-02 DIAGNOSIS — N138 Other obstructive and reflux uropathy: Secondary | ICD-10-CM

## 2016-10-02 DIAGNOSIS — E349 Endocrine disorder, unspecified: Secondary | ICD-10-CM | POA: Diagnosis not present

## 2016-10-02 DIAGNOSIS — N401 Enlarged prostate with lower urinary tract symptoms: Secondary | ICD-10-CM | POA: Diagnosis not present

## 2016-10-02 NOTE — Telephone Encounter (Signed)
Pt was seen today.

## 2016-10-03 LAB — TSH: TSH: 3.19 u[IU]/mL (ref 0.450–4.500)

## 2016-10-06 ENCOUNTER — Telehealth: Payer: Self-pay

## 2016-10-06 LAB — HEMATOCRIT: Hematocrit: 47.1 % (ref 37.5–51.0)

## 2016-10-06 LAB — PSA: PROSTATE SPECIFIC AG, SERUM: 0.6 ng/mL (ref 0.0–4.0)

## 2016-10-06 LAB — HEMOGLOBIN: Hemoglobin: 15.4 g/dL (ref 13.0–17.7)

## 2016-10-06 NOTE — Telephone Encounter (Signed)
Called patient. Gave lab results. Patient verbalized understanding.  

## 2016-10-06 NOTE — Telephone Encounter (Signed)
-----   Message from Nori Riis, PA-C sent at 10/06/2016  7:54 AM EDT ----- Please let Mr. Burck know that his PSA was normal.

## 2016-10-09 NOTE — Progress Notes (Signed)
10/10/2016 10:42 AM   Andrew Mata 1965/02/24 [id number]  Referring provider: Lynnell Jude, MD [address] [place], [place] [address]  Chief Complaint  Patient presents with  . Testopel    HPI: Patient is a 51 year old Caucasian male with testosterone deficiency, BPH with LU TS and ED who presents today for a Testopel insertion.      Testosterone deficiency Patient is experiencing a decrease in libido, a lack of energy, a decrease in strength, a decreased enjoyment in life, erections being less strong, a recent deterioration in an ability to play sports and following asleep after dinner.  This is indicated by his responses to the ADAM questionnaire.  He is no longer having spontaneous erections at night.  He has sleep apnea and is not sleeping with a CPAP machine.  He is reporting a loss of body hair, reduced beard growth and obesity.  His most recent testosterone level was 172 ng/dL on 09/25/2016  He is currently managing his testosterone deficiency with Testopel, 6 pellets q 3 months.         Androgen Deficiency in the Aging Male    [place] Name 10/02/16 0900         Androgen Deficiency in the Aging Male   Do you have a decrease in libido (sex drive) Yes     Do you have lack of energy Yes     Do you have a decrease in strength and/or endurance Yes     Have you lost height No     Have you noticed a decreased "enjoyment of life" Yes     Are you sad and/or grumpy No     Are your erections less strong Yes     Have you noticed a recent deterioration in your ability to play sports Yes     Are you falling asleep after dinner Yes     Has there been a recent deterioration in your work performance No        PMH: Past Medical History:  Diagnosis Date  . Chronic kidney disease   . Heart burn   . Hyperlipidemia   . Hypertension   . IFG (impaired fasting glucose)   . Low testosterone   . Nonspecific elevation of levels of transaminase or lactic acid dehydrogenase (LDH)   .  Obstructive sleep apnea     Surgical History: Past Surgical History:  Procedure Laterality Date  . lung collapse    . tracheotomy      Home Medications:  Allergies as of 10/10/2016   No Known Allergies     Medication List       Accurate as of 10/10/16 10:42 AM. Always use your most recent med list.          benazepril 10 MG tablet Commonly known as:  LOTENSIN TAKE 1 TABLET BY MOUTH  DAILY   MULTI-VITAMINS Tabs Take by mouth.   omeprazole 10 MG capsule Commonly known as:  PRILOSEC Take 10 mg by mouth daily.   pravastatin 40 MG tablet Commonly known as:  PRAVACHOL Take by mouth.       Allergies: No Known Allergies  Family History: Family History  Problem Relation Age of Onset  . Hypertension Mother   . Kidney cancer Mother   . Lung cancer Mother   . Lung disease Father   . Hypertension Brother   . Hypertension Maternal Grandfather   . Prostate cancer Paternal Grandfather   . Hypertension Brother   . Bladder Cancer  Neg Hx     Social History:  reports that he has never smoked. He quit smokeless tobacco use about 31 years ago. His smokeless tobacco use included Chew. He reports that he drinks alcohol. He reports that he does not use drugs.  ROS: UROLOGY Frequent Urination?: No Hard to postpone urination?: No Burning/pain with urination?: No Get up at night to urinate?: Yes Leakage of urine?: No Urine stream starts and stops?: No Trouble starting stream?: No Do you have to strain to urinate?: No Blood in urine?: No Urinary tract infection?: No Sexually transmitted disease?: No Injury to kidneys or bladder?: No Painful intercourse?: No Weak stream?: No Erection problems?: No Penile pain?: No  Gastrointestinal Nausea?: No Vomiting?: No Indigestion/heartburn?: No Diarrhea?: No Constipation?: No  Constitutional Fever: No Night sweats?: No Weight loss?: No Fatigue?: Yes  Skin Skin rash/lesions?: No Itching?: No  Eyes Blurred vision?:  No Double vision?: No  Ears/Nose/Throat Sore throat?: No Sinus problems?: No  Hematologic/Lymphatic Swollen glands?: No Easy bruising?: No  Cardiovascular Leg swelling?: No Chest pain?: No  Respiratory Cough?: No Shortness of breath?: No  Endocrine Excessive thirst?: No  Musculoskeletal Back pain?: No Joint pain?: No  Neurological Headaches?: No Dizziness?: No  Psychologic Depression?: No Anxiety?: No  Physical Exam: BP (!) 151/95   Pulse 96   Ht 5\' 10"  (1.778 m)   Wt 295 lb (133.8 kg)   BMI 42.33 kg/m   Constitutional: Well nourished. Alert and oriented, No acute distress. HEENT: [place] AT, moist mucus membranes. Trachea midline, no masses. Cardiovascular: No clubbing, cyanosis, or edema. Respiratory: Normal respiratory effort, no increased work of breathing. GI: Abdomen is soft, non tender, non distended, no abdominal masses.  Skin: No rashes, bruises or suspicious lesions. Lymph: No cervical or inguinal adenopathy. Neurologic: Grossly intact, no focal deficits, moving all 4 extremities. Psychiatric: Normal mood and affect.  Laboratory Data: PSA History  0.5 ng/mL on 02/25/2015  0.5 ng/mL on 11/05/2015  0.4 ng/mL on 05/02/2016  Lab Results  Component Value Date   TESTOSTERONE 172 (L) 09/25/2016    Lab Results  Component Value Date   AST 26 05/02/2016   Lab Results  Component Value Date   ALT 29 05/02/2016   I have reviewed the labs  Procedure Patient is placed on the exam table in the right lateral jackknife position.  Identified upper outer quadrant of hip for insertion; prepped area with Betadine and injected 10 cc's of Lidocaine 1% with Epinephrine to anesthetize superficially and distally along trocar tract.  Made 3 mm incision using 15 blade of scalpel; trocar with sharp ended stylet was inserted into subcutaneous tissue in line with femur. Sharp stylet was withdrawn and 6 pellets were placed into trocar well. Testopel pellets advanced  into tissue using blunt ended stylet. Trocar removed and incision closed using 6 Steri-Strips. Cleansed area to remove Betadine and covered Steri-Strips with outer Band-Aid.  Careful inspection of insertion is done and patient informed of post procedure instructions.  Advised patient to apply ice to the site for 20-30 minutes every hour if needed.  Avoid hot tubes, swimming or full water immersion of the insertion site for 72 hours.  Bandage may be removed after one week.    Patient is advised to contact the office if experiencing drainage of the insertion site, excessive redness or swelling of the site, chills and/or fevers > 101.5, nausea or vomiting, dizziness or lightheadedness and excessive tenderness.  Avoid strenuous activity and heavy lifting for 72 hours.  He will return in three month for serum testosterone, HCT and HBG  Assessment & Plan:    1. Testosterone deficiency    - most recent testosterone level is 172 ng/dL on 09/25/2016  - Testopel insertion today - 6 pellets  - RTC in 3 months for testosterone, HBG and HCT    Return in about 3 months (around 01/09/2017) for testosterone, HBG and HCT.  These notes generated with voice recognition software. I apologize for typographical errors.  Zara Council, [facility] [address] [place], [place] [address] [phone]

## 2016-10-10 ENCOUNTER — Encounter: Payer: Self-pay | Admitting: Urology

## 2016-10-10 ENCOUNTER — Ambulatory Visit: Payer: 59 | Admitting: Urology

## 2016-10-10 VITALS — BP 151/95 | HR 96 | Ht 70.0 in | Wt 295.0 lb

## 2016-10-10 DIAGNOSIS — E349 Endocrine disorder, unspecified: Secondary | ICD-10-CM | POA: Diagnosis not present

## 2016-11-20 ENCOUNTER — Other Ambulatory Visit: Payer: Self-pay | Admitting: Family Medicine

## 2016-12-27 ENCOUNTER — Other Ambulatory Visit: Payer: Self-pay

## 2016-12-27 DIAGNOSIS — E349 Endocrine disorder, unspecified: Secondary | ICD-10-CM

## 2016-12-28 ENCOUNTER — Other Ambulatory Visit: Payer: 59

## 2016-12-28 DIAGNOSIS — E349 Endocrine disorder, unspecified: Secondary | ICD-10-CM

## 2016-12-29 LAB — TESTOSTERONE: TESTOSTERONE: 299 ng/dL (ref 264–916)

## 2016-12-29 LAB — HEMOGLOBIN: HEMOGLOBIN: 14.6 g/dL (ref 13.0–17.7)

## 2016-12-29 LAB — HEMATOCRIT: Hematocrit: 45.9 % (ref 37.5–51.0)

## 2017-01-01 ENCOUNTER — Telehealth: Payer: Self-pay

## 2017-01-01 NOTE — Telephone Encounter (Signed)
Pt has been made aware and appt scheduled.

## 2017-01-01 NOTE — Telephone Encounter (Signed)
-----   Message from Nori Riis, PA-C sent at 12/29/2016  7:43 AM EST ----- Patient's labs are normal.  He needs to be scheduled for a Testopel insertion.

## 2017-01-01 NOTE — Progress Notes (Signed)
01/03/2017 10:39 AM   Andrew Mata 05-14-1965 [id number]  Referring provider: Lynnell Jude, MD [address] [place], [place] [address]  Chief Complaint  Patient presents with  . Testopel    HPI: Patient is a 51 year old Caucasian male with testosterone deficiency, BPH with LU TS and ED who presents today for a Testopel insertion.      Testosterone deficiency Patient is experiencing a decrease in libido, a lack of energy, a decrease in strength, a decreased enjoyment in life, erections being less strong, a recent deterioration in an ability to play sports and following asleep after dinner.  This is indicated by his responses to the ADAM questionnaire.  He is no longer having spontaneous erections at night.  He has sleep apnea and is not sleeping with a CPAP machine.  He is reporting a loss of body hair, reduced beard growth and obesity.  His most recent testosterone level was 299 ng/dL on 12/13//2018.  His HBG/HCT are normal.  He is currently managing his testosterone deficiency with Testopel, 6 pellets q 3 months.       PMH: Past Medical History:  Diagnosis Date  . Chronic kidney disease   . Heart burn   . Hyperlipidemia   . Hypertension   . IFG (impaired fasting glucose)   . Low testosterone   . Nonspecific elevation of levels of transaminase or lactic acid dehydrogenase (LDH)   . Obstructive sleep apnea     Surgical History: Past Surgical History:  Procedure Laterality Date  . lung collapse    . tracheotomy      Home Medications:  Allergies as of 01/03/2017   No Known Allergies     Medication List        Accurate as of 01/03/17 10:39 AM. Always use your most recent med list.          benazepril 10 MG tablet Commonly known as:  LOTENSIN TAKE 1 TABLET BY MOUTH  DAILY   MULTI-VITAMINS Tabs Take by mouth.   omeprazole 10 MG capsule Commonly known as:  PRILOSEC Take 10 mg by mouth daily.   pravastatin 40 MG tablet Commonly known as:   PRAVACHOL Take by mouth.       Allergies: No Known Allergies  Family History: Family History  Problem Relation Age of Onset  . Hypertension Mother   . Kidney cancer Mother   . Lung cancer Mother   . Lung disease Father   . Hypertension Brother   . Hypertension Maternal Grandfather   . Prostate cancer Paternal Grandfather   . Hypertension Brother   . Bladder Cancer Neg Hx     Social History:  reports that  has never smoked. He quit smokeless tobacco use about 31 years ago. His smokeless tobacco use included chew. He reports that he drinks alcohol. He reports that he does not use drugs.  ROS: UROLOGY Frequent Urination?: No Hard to postpone urination?: No Burning/pain with urination?: No Get up at night to urinate?: No Leakage of urine?: No Urine stream starts and stops?: No Trouble starting stream?: No Do you have to strain to urinate?: No Blood in urine?: No Urinary tract infection?: No Sexually transmitted disease?: No Injury to kidneys or bladder?: No Painful intercourse?: No Weak stream?: No Erection problems?: No Penile pain?: No  Gastrointestinal Nausea?: No Vomiting?: No Indigestion/heartburn?: No Diarrhea?: No Constipation?: No  Constitutional Fever: No Night sweats?: No Weight loss?: No Fatigue?: No  Skin Skin rash/lesions?: No Itching?: No  Eyes Blurred  vision?: No Double vision?: No  Ears/Nose/Throat Sore throat?: No Sinus problems?: No  Hematologic/Lymphatic Swollen glands?: No Easy bruising?: No  Cardiovascular Leg swelling?: No Chest pain?: No  Respiratory Cough?: No Shortness of breath?: No  Endocrine Excessive thirst?: No  Musculoskeletal Back pain?: No Joint pain?: No  Neurological Headaches?: No Dizziness?: No  Psychologic Depression?: No Anxiety?: No  Physical Exam: BP (!) 155/80   Pulse 96   Ht 5\' 10"  (1.778 m)   Wt (!) 320 lb 11.2 oz (145.5 kg)   BMI 46.02 kg/m   Constitutional: Well nourished.  Alert and oriented, No acute distress. HEENT: [place] AT, moist mucus membranes. Trachea midline, no masses. Cardiovascular: No clubbing, cyanosis, or edema. Respiratory: Normal respiratory effort, no increased work of breathing. GI: Abdomen is soft, non tender, non distended, no abdominal masses.  Skin: No rashes, bruises or suspicious lesions. Lymph: No cervical or inguinal adenopathy. Neurologic: Grossly intact, no focal deficits, moving all 4 extremities. Psychiatric: Normal mood and affect.  Laboratory Data: PSA History  0.5 ng/mL on 02/25/2015  0.5 ng/mL on 11/05/2015  0.4 ng/mL on 05/02/2016  0.6 ng/mL in 09/2016  Lab Results  Component Value Date   TESTOSTERONE 299 12/28/2016    Lab Results  Component Value Date   AST 26 05/02/2016   Lab Results  Component Value Date   ALT 29 05/02/2016   I have reviewed the labs  Procedure Patient is placed on the exam table in the right lateral jackknife position.  Identified upper outer quadrant of hip for insertion; prepped area with Betadine and injected 10 cc's of Lidocaine 1% with Epinephrine to anesthetize superficially and distally along trocar tract.  Made 3 mm incision using 15 blade of scalpel; trocar with sharp ended stylet was inserted into subcutaneous tissue in line with femur. Sharp stylet was withdrawn and 6 pellets were placed into trocar well. Testopel pellets advanced into tissue using blunt ended stylet. Trocar removed and incision closed using 6 Steri-Strips. Cleansed area to remove Betadine and covered Steri-Strips with outer Band-Aid.  Careful inspection of insertion is done and patient informed of post procedure instructions.  Advised patient to apply ice to the site for 20-30 minutes every hour if needed.  Avoid hot tubes, swimming or full water immersion of the insertion site for 72 hours.  Bandage may be removed after one week.    Patient is advised to contact the office if experiencing drainage of the  insertion site, excessive redness or swelling of the site, chills and/or fevers > 101.5, nausea or vomiting, dizziness or lightheadedness and excessive tenderness.  Avoid strenuous activity and heavy lifting for 72 hours.     He will return in three month for serum testosterone, HCT and HBG  Assessment & Plan:    1. Testosterone deficiency    - most recent testosterone level is 299 ng/dL on 12/28/2016  - Testopel insertion today - 6 pellets  - RTC in 3 months for testosterone, HBG and HCT and office visit  Return in about 3 months (around 04/03/2017) for PSA, HCT, HBG, testosterone , ADAM, IPSS, SHIM and exam.  These notes generated with voice recognition software. I apologize for typographical errors.  Zara Council, [facility] [address] [place], [place] [address] [phone]

## 2017-01-03 ENCOUNTER — Encounter: Payer: Self-pay | Admitting: Urology

## 2017-01-03 ENCOUNTER — Other Ambulatory Visit: Payer: Self-pay

## 2017-01-03 ENCOUNTER — Ambulatory Visit: Payer: 59 | Admitting: Urology

## 2017-01-03 VITALS — BP 155/80 | HR 96 | Ht 70.0 in | Wt 320.7 lb

## 2017-01-03 DIAGNOSIS — E349 Endocrine disorder, unspecified: Secondary | ICD-10-CM

## 2017-03-27 ENCOUNTER — Other Ambulatory Visit: Payer: Self-pay

## 2017-03-27 ENCOUNTER — Other Ambulatory Visit: Payer: 59

## 2017-03-27 DIAGNOSIS — E349 Endocrine disorder, unspecified: Secondary | ICD-10-CM

## 2017-03-28 LAB — HEMOGLOBIN: HEMOGLOBIN: 14.9 g/dL (ref 13.0–17.7)

## 2017-03-28 LAB — HEMATOCRIT: Hematocrit: 46.7 % (ref 37.5–51.0)

## 2017-03-28 LAB — TESTOSTERONE: TESTOSTERONE: 371 ng/dL (ref 264–916)

## 2017-03-28 LAB — PSA: Prostate Specific Ag, Serum: 0.8 ng/mL (ref 0.0–4.0)

## 2017-04-02 NOTE — Progress Notes (Signed)
04/03/2017 9:06 AM   Andrew Mata [birth year] [id number]  Referring provider: Lynnell Jude, MD [address] [place], [place] [address]  No chief complaint on file.   HPI: Patient is a 52 year old Caucasian male with testosterone deficiency, BPH with LU TS and ED who presents today for a 3 month follow up.      Testosterone deficiency He is no longer having spontaneous erections at night.  He has sleep apnea and is not sleeping with a CPAP machine.  He is reporting a loss of body hair, reduced beard growth and obesity.  His most recent testosterone level was 371 ng/dL on 03/27/2017.  His HBG/HCT are normal.  He is sleeping with his CPAP.  He is currently managing his testosterone deficiency with Testopel, 6 pellets q 3 months.      BPH WITH LUTS  (prostate and/or bladder) IPSS score: 7/1  Previous score: 2/1   Major complaint(s): nocturia x 1. Denies any dysuria, hematuria or suprapubic pain.   Denies any recent fevers, chills, nausea or vomiting.  He does not have a family history of PCa.  IPSS    Row Name 04/03/17 0800         International Prostate Symptom Score   How often have you had the sensation of not emptying your bladder?  Not at All     How often have you had to urinate less than every two hours?  Less than 1 in 5 times     How often have you found you stopped and started again several times when you urinated?  Less than 1 in 5 times     How often have you found it difficult to postpone urination?  About half the time     How often have you had a weak urinary stream?  Less than 1 in 5 times     How often have you had to strain to start urination?  Not at All     How many times did you typically get up at night to urinate?  1 Time     Total IPSS Score  7       Quality of Life due to urinary symptoms   If you were to spend the rest of your life with your urinary condition just the way it is now how would you feel about that?  Pleased        Score:  1-7  Mild 8-19 Moderate 20-35 Severe   Erectile dysfunction His SHIM score is 24, which is no ED.   His previous SHIM score was 22.  His risk factors for ED are age, BPH, testosterone deficiency, HTN, HLD, sleep apnea and alcohol abuse.  He denies any painful erections or curvatures with his erections.   He is no longer having spontaneous erections.     SHIM    Row Name 04/03/17 0848         SHIM: Over the last 6 months:   How do you rate your confidence that you could get and keep an erection?  High     When you had erections with sexual stimulation, how often were your erections hard enough for penetration (entering your partner)?  Almost Always or Always     During sexual intercourse, how often were you able to maintain your erection after you had penetrated (entered) your partner?  Almost Always or Always     During sexual intercourse, how difficult was it to maintain your erection to completion  of intercourse?  Not Difficult     When you attempted sexual intercourse, how often was it satisfactory for you?  Almost Always or Always       SHIM Total Score   SHIM  24        Score: 1-7 Severe ED 8-11 Moderate ED 12-16 Mild-Moderate ED 17-21 Mild ED 22-25 No ED  PMH: Past Medical History:  Diagnosis Date  . Chronic kidney disease   . Heart burn   . Hyperlipidemia   . Hypertension   . IFG (impaired fasting glucose)   . Low testosterone   . Nonspecific elevation of levels of transaminase or lactic acid dehydrogenase (LDH)   . Obstructive sleep apnea     Surgical History: Past Surgical History:  Procedure Laterality Date  . lung collapse    . tracheotomy      Home Medications:  Allergies as of 04/03/2017   No Known Allergies     Medication List        Accurate as of 04/03/17  9:06 AM. Always use your most recent med list.          benazepril 10 MG tablet Commonly known as:  LOTENSIN TAKE 1 TABLET BY MOUTH  DAILY   MULTI-VITAMINS Tabs Take by mouth.     omeprazole 10 MG capsule Commonly known as:  PRILOSEC Take 10 mg by mouth daily.   pravastatin 40 MG tablet Commonly known as:  PRAVACHOL Take by mouth.       Allergies: No Known Allergies  Family History: Family History  Problem Relation Age of Onset  . Hypertension Mother   . Kidney cancer Mother   . Lung cancer Mother   . Lung disease Father   . Hypertension Brother   . Hypertension Maternal Grandfather   . Prostate cancer Paternal Grandfather   . Hypertension Brother   . Bladder Cancer Neg Hx     Social History:  reports that  has never smoked. He quit smokeless tobacco use about 32 years ago. His smokeless tobacco use included chew. He reports that he drinks alcohol. He reports that he does not use drugs.  ROS: UROLOGY Frequent Urination?: No Hard to postpone urination?: No Burning/pain with urination?: No Get up at night to urinate?: No Leakage of urine?: No Urine stream starts and stops?: No Trouble starting stream?: No Do you have to strain to urinate?: No Blood in urine?: No Urinary tract infection?: No Sexually transmitted disease?: No Injury to kidneys or bladder?: No Painful intercourse?: No Weak stream?: No Erection problems?: No Penile pain?: No  Gastrointestinal Nausea?: No Vomiting?: No Indigestion/heartburn?: No Diarrhea?: No Constipation?: No  Constitutional Fever: No Night sweats?: No Weight loss?: No Fatigue?: Yes  Skin Skin rash/lesions?: No Itching?: No  Eyes Blurred vision?: No Double vision?: No  Ears/Nose/Throat Sore throat?: No Sinus problems?: No  Hematologic/Lymphatic Swollen glands?: No Easy bruising?: No  Cardiovascular Leg swelling?: No Chest pain?: No  Respiratory Cough?: No Shortness of breath?: No  Endocrine Excessive thirst?: No  Musculoskeletal Back pain?: No Joint pain?: No  Neurological Headaches?: No Dizziness?: No  Psychologic Depression?: No Anxiety?: No  Physical Exam: BP  (!) 143/104   Pulse (!) 118   Resp 16   Ht 5\' 11"  (1.803 m)   Wt (!) 313 lb 3.2 oz (142.1 kg)   HC 16" (40.6 cm)   SpO2 98%   BMI 43.68 kg/m   Constitutional: Well nourished. Alert and oriented, No acute distress. HEENT: [place] AT, moist  mucus membranes. Trachea midline, no masses. Cardiovascular: No clubbing, cyanosis, or edema. Respiratory: Normal respiratory effort, no increased work of breathing. GI: Abdomen is soft, non tender, non distended, no abdominal masses. Liver and spleen not palpable.  No hernias appreciated.  Stool sample for occult testing is not indicated.   GU: No CVA tenderness.  No bladder fullness or masses.  Patient with uncircumcised phallus.  Foreskin easily retracted  Urethral meatus is patent.  No penile discharge. No penile lesions or rashes. Scrotum without lesions, cysts, rashes and/or edema.  Testicles are located scrotally bilaterally. No masses are appreciated in the testicles. Left and right epididymis are normal. Rectal: Patient with  normal sphincter tone. Anus and perineum without scarring or rashes. No rectal masses are appreciated. Prostate is approximately 50 grams, no nodules are appreciated. Seminal vesicles are normal. Skin: No rashes, bruises or suspicious lesions. Lymph: No cervical or inguinal adenopathy. Neurologic: Grossly intact, no focal deficits, moving all 4 extremities. Psychiatric: Normal mood and affect.  Laboratory Data: PSA History  0.5 ng/mL on 02/25/2015  0.5 ng/mL on 11/05/2015  0.4 ng/mL on 05/02/2016  0.8 ng/mL on 03/27/2016  0.6 ng/mL in 09/2016  0.8 ng/mL in 03/27/2017  Lab Results  Component Value Date   TESTOSTERONE 371 03/27/2017    Lab Results  Component Value Date   AST 26 05/02/2016   Lab Results  Component Value Date   ALT 29 05/02/2016   I have reviewed the labs  Assessment & Plan:    1. Testosterone deficiency    - most recent testosterone level is 371 ng/dL on 03/27/2017  - schedule Testopel  insertion  2. BPH with LUTS  - IPSS score is 7/1, it is worsening  - Continue conservative management, avoiding bladder irritants and timed voiding's  - most bothersome symptom is nocturia x 1  - RTC in 6 months for IPSS, PSA, PVR and exam, as testosterone therapy can cause prostate enlargement and worsen LUTS  3. Erectile dysfunction:     -SHIM score is 24, it is improving  -RTC in 6 months for SHIM score and exam, as testosterone therapy can affect erections  Return for schedule Testopel .  These notes generated with voice recognition software. I apologize for typographical errors.  Zara Council, [facility] [address], [place], [place] [address] [phone]

## 2017-04-03 ENCOUNTER — Ambulatory Visit (INDEPENDENT_AMBULATORY_CARE_PROVIDER_SITE_OTHER): Payer: 59 | Admitting: Urology

## 2017-04-03 ENCOUNTER — Encounter: Payer: Self-pay | Admitting: Urology

## 2017-04-03 VITALS — BP 143/104 | HR 118 | Resp 16 | Ht 71.0 in | Wt 313.2 lb

## 2017-04-03 DIAGNOSIS — E349 Endocrine disorder, unspecified: Secondary | ICD-10-CM

## 2017-04-03 DIAGNOSIS — N401 Enlarged prostate with lower urinary tract symptoms: Secondary | ICD-10-CM

## 2017-04-03 DIAGNOSIS — N529 Male erectile dysfunction, unspecified: Secondary | ICD-10-CM | POA: Diagnosis not present

## 2017-04-03 DIAGNOSIS — N138 Other obstructive and reflux uropathy: Secondary | ICD-10-CM

## 2017-04-13 NOTE — Progress Notes (Signed)
This is a 52 year old male with hypogonadism and he is managed with Testopel. He presents today for Testopel insertion.  Patient is placed on the exam table in the left lateral jackknife position.  Identified upper outer quadrant of hip for insertion; prepped area with Betadine and injected 10 cc's of Lidocaine 1% with Epinephrine to anesthetize superficially and distally along trocar tract.  Made 3 mm incision using 15 blade of scalpel; trocar with sharp ended stylet was inserted into subcutaneous tissue in line with femur. Sharp stylet was withdrawn and 6 pellets were placed into trocar well. Testopel pellets advanced into tissue using blunt ended stylet. Trocar removed and incision closed using 6 Steri-Strips. Cleansed area to remove Betadine and covered Steri-Strips with outer Band-Aid.  Careful inspection of insertion is done and patient informed of post procedure instructions.  Advised patient to apply ice to the site for 20-30 minutes every hour if needed.  Avoid hot tubes, swimming or full water immersion of the insertion site for 72 hours.  Bandage may be removed after one week.    Patient is advised to contact the office if experiencing drainage of the insertion site, excessive redness or swelling of the site, chills and/or fevers > 101.5, nausea or vomiting, dizziness or lightheadedness and excessive tenderness.  Avoid strenuous activity and heavy lifting for 72 hours.     He will return in three months for serum testosterone.    Prescription given for Sildenafil 20 mg, 3 to 5 tablets two hours prior to intercourse on an empty stomach, # 24; he is warned not to take medications that contain nitrates.  I also advised him of the side effects, such as: headache, flushing, dyspepsia, abnormal vision, nasal congestion, back pain, myalgia, nausea, dizziness, and rash.

## 2017-04-16 ENCOUNTER — Encounter: Payer: Self-pay | Admitting: Urology

## 2017-04-16 ENCOUNTER — Ambulatory Visit: Payer: 59 | Admitting: Urology

## 2017-04-16 VITALS — BP 150/90 | HR 107 | Resp 16 | Ht 71.0 in | Wt 320.6 lb

## 2017-04-16 DIAGNOSIS — E349 Endocrine disorder, unspecified: Secondary | ICD-10-CM

## 2017-04-16 MED ORDER — SILDENAFIL CITRATE 20 MG PO TABS: ORAL_TABLET | ORAL | 3 refills | Status: AC

## 2017-04-28 ENCOUNTER — Other Ambulatory Visit: Payer: Self-pay | Admitting: Unknown Physician Specialty

## 2017-07-13 ENCOUNTER — Other Ambulatory Visit: Payer: Self-pay | Admitting: Family Medicine

## 2017-07-13 DIAGNOSIS — E349 Endocrine disorder, unspecified: Secondary | ICD-10-CM

## 2017-07-16 ENCOUNTER — Other Ambulatory Visit: Payer: 59

## 2019-04-11 ENCOUNTER — Ambulatory Visit: Payer: 59 | Attending: Internal Medicine

## 2019-04-11 DIAGNOSIS — Z23 Encounter for immunization: Secondary | ICD-10-CM

## 2019-04-11 NOTE — Progress Notes (Signed)
   Covid-19 Vaccination Clinic  Name:  Andrew Mata    MRN: [id number] DOB: 08-Aug-1965  04/11/2019  [name] was observed post Covid-19 immunization for 15 minutes without incident. He was provided with Vaccine Information Sheet and instruction to access the V-Safe system.   [name] was instructed to call 911 with any severe reactions post vaccine: Marland Kitchen Difficulty breathing  . Swelling of face and throat  . A fast heartbeat  . A bad rash all over body  . Dizziness and weakness   Immunizations Administered    Name Date Dose VIS Date Route   Pfizer COVID-19 Vaccine 04/11/2019  3:30 PM 0.3 mL 12/27/2018 Intramuscular   Manufacturer: [place]   Lot: [id number]   [place]: [id number]

## 2019-05-07 ENCOUNTER — Ambulatory Visit: Payer: 59 | Attending: Internal Medicine

## 2019-05-07 DIAGNOSIS — Z23 Encounter for immunization: Secondary | ICD-10-CM

## 2019-05-07 NOTE — Progress Notes (Signed)
   Covid-19 Vaccination Clinic  Name:  [name]    MRN: [id number] DOB: Oct 27, 1965  05/07/2019  [name] was observed post Covid-19 immunization for 15 minutes without incident. He was provided with Vaccine Information Sheet and instruction to access the V-Safe system.   [name] was instructed to call 911 with any severe reactions post vaccine: Marland Kitchen Difficulty breathing  . Swelling of face and throat  . A fast heartbeat  . A bad rash all over body  . Dizziness and weakness   Immunizations Administered    Name Date Dose VIS Date Route   Pfizer COVID-19 Vaccine 05/07/2019  3:58 PM 0.3 mL 03/12/2018 Intramuscular   Manufacturer: [place]   Lot: [id number]   [place]: [id number]

## 2019-11-24 ENCOUNTER — Ambulatory Visit: Payer: 59 | Attending: Critical Care Medicine

## 2019-11-24 DIAGNOSIS — Z23 Encounter for immunization: Secondary | ICD-10-CM

## 2019-11-24 NOTE — Progress Notes (Signed)
   Covid-19 Vaccination Clinic  Name:  Andrew Mata    MRN: [id number] DOB: 1965-11-22  11/24/2019  Andrew Mata was observed post Covid-19 immunization for 15 minutes without incident. He was provided with Vaccine Information Sheet and instruction to access the V-Safe system.   Andrew Mata was instructed to call 911 with any severe reactions post vaccine: Marland Kitchen Difficulty breathing  . Swelling of face and throat  . A fast heartbeat  . A bad rash all over body  . Dizziness and weakness

## 2020-05-16 DIAGNOSIS — Z8616 Personal history of COVID-19: Secondary | ICD-10-CM

## 2020-05-16 HISTORY — DX: Personal history of COVID-19: Z86.16

## 2020-07-01 DIAGNOSIS — R0602 Shortness of breath: Secondary | ICD-10-CM | POA: Insufficient documentation

## 2020-07-08 ENCOUNTER — Other Ambulatory Visit: Payer: Self-pay

## 2020-07-08 ENCOUNTER — Ambulatory Visit: Payer: 59 | Admitting: Surgery

## 2020-07-08 ENCOUNTER — Encounter: Payer: Self-pay | Admitting: Surgery

## 2020-07-08 VITALS — BP 115/81 | HR 97 | Temp 98.3°F | Ht 71.0 in | Wt 279.6 lb

## 2020-07-08 DIAGNOSIS — K429 Umbilical hernia without obstruction or gangrene: Secondary | ICD-10-CM | POA: Diagnosis not present
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
## 2020-07-08 NOTE — Patient Instructions (Signed)
Umbilical Hernia, Adult  A hernia is a bulge of tissue that pushes through an opening between muscles. An umbilical hernia happens in the abdomen, near the belly button (umbilicus). The hernia may contain tissues from the small intestine, large intestine, or fatty tissue covering the intestines (omentum). Umbilical hernias in adults tend to get worse over time, and they requiresurgical treatment. There are several types of umbilical hernias. You may have: A hernia located just above or below the umbilicus (indirect hernia). This is the most common type of umbilical hernia in adults. A hernia that forms through an opening formed by the umbilicus (direct hernia). A hernia that comes and goes (reducible hernia). A reducible hernia may be visible only when you strain, lift something heavy, or cough. This type of hernia can be pushed back into the abdomen (reduced). A hernia that traps abdominal tissue inside the hernia (incarcerated hernia). This type of hernia cannot be reduced. A hernia that cuts off blood flow to the tissues inside the hernia (strangulated hernia). The tissues can start to die if this happens. This type of hernia requires emergency treatment. What are the causes? An umbilical hernia happens when tissue inside the abdomen presses on a weakarea of the abdominal muscles. What increases the risk? You may have a greater risk of this condition if you: Are obese. Have had several pregnancies. Have a buildup of fluid inside your abdomen (ascites). Have had surgery that weakens the abdominal muscles. What are the signs or symptoms? The main symptom of this condition is a painless bulge at or near the belly button. A reducible hernia may be visible only when you strain, lift something heavy, or cough. Other symptoms may include: Dull pain. A feeling of pressure. Symptoms of a strangulated hernia may include: Pain that gets increasingly worse. Nausea and vomiting. Pain when pressing on  the hernia. Skin over the hernia becoming red or purple. Constipation. Blood in the stool. How is this diagnosed? This condition may be diagnosed based on: A physical exam. You may be asked to cough or strain while standing. These actions increase the pressure inside your abdomen and force the hernia through the opening in your muscles. Your health care provider may try to reduce the hernia by pressing on it. Your symptoms and medical history. How is this treated? Surgery is the only treatment for an umbilical hernia. Surgery for a strangulated hernia is done as soon as possible. If you have a small herniathat is not incarcerated, you may need to lose weight before having surgery. Follow these instructions at home: Lose weight, if told by your health care provider. Do not try to push the hernia back in. Watch your hernia for any changes in color or size. Tell your health care provider if any changes occur. You may need to avoid activities that increase pressure on your hernia. Do not lift anything that is heavier than 10 lb (4.5 kg) until your health care provider says that this is safe. Take over-the-counter and prescription medicines only as told by your health care provider. Keep all follow-up visits as told by your health care provider. This is important. Contact a health care provider if: Your hernia gets larger. Your hernia becomes painful. Get help right away if: You develop sudden, severe pain near the area of your hernia. You have pain as well as nausea or vomiting. You have pain and the skin over your hernia changes color. You develop a fever. This information is not intended to replace advice  given to you by your health care provider. Make sure you discuss any questions you have with your healthcare provider. Document Revised: 02/14/2017 Document Reviewed: 07/03/2016 Elsevier Patient Education  [place].

## 2020-07-29 ENCOUNTER — Ambulatory Visit: Payer: Self-pay | Admitting: Surgery

## 2020-07-29 ENCOUNTER — Ambulatory Visit: Payer: 59 | Admitting: Surgery

## 2020-07-29 ENCOUNTER — Encounter: Payer: Self-pay | Admitting: Surgery

## 2020-07-29 VITALS — BP 121/86 | HR 101 | Temp 98.5°F | Ht 71.0 in | Wt 282.6 lb

## 2020-07-29 DIAGNOSIS — K429 Umbilical hernia without obstruction or gangrene: Secondary | ICD-10-CM
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
## 2020-07-29 NOTE — Patient Instructions (Signed)
Our surgery scheduler Pamala Hurry will call you within 24-48 hours to get you scheduled. If you have not heard from her after 48 hours, please call our office. You will not need to get Covid tested before surgery and have the blue sheet available when she calls to write down important information.   Umbilical Hernia, Adult   A hernia is a bulge of tissue that pushes through an opening between muscles. An umbilical hernia happens in the abdomen, near the belly button (umbilicus). The hernia may contain tissues from the small intestine, large intestine, or fatty tissue covering the intestines (omentum). Umbilical hernias in adults tend to get worse over time, and they requiresurgical treatment. There are several types of umbilical hernias. You may have: A hernia located just above or below the umbilicus (indirect hernia). This is the most common type of umbilical hernia in adults. A hernia that forms through an opening formed by the umbilicus (direct hernia). A hernia that comes and goes (reducible hernia). A reducible hernia may be visible only when you strain, lift something heavy, or cough. This type of hernia can be pushed back into the abdomen (reduced). A hernia that traps abdominal tissue inside the hernia (incarcerated hernia). This type of hernia cannot be reduced. A hernia that cuts off blood flow to the tissues inside the hernia (strangulated hernia). The tissues can start to die if this happens. This type of hernia requires emergency treatment. What are the causes? An umbilical hernia happens when tissue inside the abdomen presses on a weakarea of the abdominal muscles. What increases the risk? You may have a greater risk of this condition if you: Are obese. Have had several pregnancies. Have a buildup of fluid inside your abdomen (ascites). Have had surgery that weakens the abdominal muscles. What are the signs or symptoms? The main symptom of this condition is a painless bulge at or  near the belly button. A reducible hernia may be visible only when you strain, lift something heavy, or cough. Other symptoms may include: Dull pain. A feeling of pressure. Symptoms of a strangulated hernia may include: Pain that gets increasingly worse. Nausea and vomiting. Pain when pressing on the hernia. Skin over the hernia becoming red or purple. Constipation. Blood in the stool. How is this diagnosed? This condition may be diagnosed based on: A physical exam. You may be asked to cough or strain while standing. These actions increase the pressure inside your abdomen and force the hernia through the opening in your muscles. Your health care provider may try to reduce the hernia by pressing on it. Your symptoms and medical history. How is this treated? Surgery is the only treatment for an umbilical hernia. Surgery for a strangulated hernia is done as soon as possible. If you have a small herniathat is not incarcerated, you may need to lose weight before having surgery. Follow these instructions at home: Lose weight, if told by your health care provider. Do not try to push the hernia back in. Watch your hernia for any changes in color or size. Tell your health care provider if any changes occur. You may need to avoid activities that increase pressure on your hernia. Do not lift anything that is heavier than 10 lb (4.5 kg) until your health care provider says that this is safe. Take over-the-counter and prescription medicines only as told by your health care provider. Keep all follow-up visits as told by your health care provider. This is important. Contact a health care provider if: Your  hernia gets larger. Your hernia becomes painful. Get help right away if: You develop sudden, severe pain near the area of your hernia. You have pain as well as nausea or vomiting. You have pain and the skin over your hernia changes color. You develop a fever. This information is not intended to  replace advice given to you by your health care provider. Make sure you discuss any questions you have with your healthcare provider. Document Revised: 02/14/2017 Document Reviewed: 07/03/2016 Elsevier Patient Education  [place].

## 2020-07-30 ENCOUNTER — Telehealth: Payer: Self-pay | Admitting: Surgery

## 2020-07-30 NOTE — Telephone Encounter (Signed)
Patient has been advised of Pre-Admission date/time, COVID Testing date and Surgery date.  Surgery Date: 08/13/20 Preadmission Testing Date: 08/04/20 (phone 1p-5p) Covid Testing Date: Not needed.    Patient has been made aware to call [phone], between 1-3:00pm the day before surgery, to find out what time to arrive for surgery.

## 2020-08-04 ENCOUNTER — Other Ambulatory Visit: Payer: Self-pay

## 2020-08-04 ENCOUNTER — Encounter
Admission: RE | Admit: 2020-08-04 | Discharge: 2020-08-04 | Disposition: A | Discharge status: Home / Self Care | Payer: 59 | Source: Ambulatory Visit | Attending: Surgery | Admitting: Surgery

## 2020-08-04 HISTORY — DX: Gastro-esophageal reflux disease without esophagitis: K21.9

## 2020-08-04 HISTORY — DX: Type 2 diabetes mellitus without complications: E11.9

## 2020-08-04 NOTE — Patient Instructions (Addendum)
Your procedure is scheduled on: 08/13/2020 Friday  Report to the Registration Desk on the 1st floor of the [facility]. To find out your arrival time, please call [phone] between 1PM - 3PM on: THURSDAY  08/12/2020  REMEMBER: Instructions that are not followed completely may result in serious medical risk, up to and including death; or upon the discretion of your surgeon and anesthesiologist your surgery may need to be rescheduled.  Do not eat food after midnight the night before surgery.  No gum chewing, lozengers or hard candies.  You may however, drink CLEAR liquids up to 2 hours before you are scheduled to arrive for your surgery. Do not drink anything within 2 hours of your scheduled arrival time.  Clear liquids include: - water   TAKE THESE MEDICATIONS THE MORNING OF SURGERY WITH A SIP OF WATER: OMEPRAZOLE  (take one the night before and one on the morning of surgery - helps to prevent nausea after surgery.)  Stop Metformin 2 days prior to surgery. LAST DOSE 08/10/2020 TUESDAY  One week prior to surgery: Stop Anti-inflammatories (NSAIDS) such as Advil, Aleve, Ibuprofen, Motrin, Naproxen, Naprosyn and ASPIRIN OR Aspirin based products such as Excedrin, Goodys Powder, BC Powder. Stop ANY OVER THE COUNTER supplements until after surgery. You may however, continue to take Tylenol if needed for pain up until the day of surgery.  No Alcohol for 24 hours before or after surgery.  No Smoking including e-cigarettes for 24 hours prior to surgery.  No chewable tobacco products for at least 6 hours prior to surgery.  No nicotine patches on the day of surgery.  Do not use any "recreational" drugs for at least a week prior to your surgery.  Please be advised that the combination of cocaine and anesthesia may have negative outcomes, up to and including death. If you test positive for cocaine, your surgery will be cancelled.  On the morning of surgery brush your teeth with toothpaste  and water, you may rinse your mouth with mouthwash if you wish. Do not swallow any toothpaste or mouthwash.  Do not wear jewelry, make-up, hairpins, clips or nail polish.  Do not wear lotions, powders, or perfumes OR DEODORANT    Do not shave body from the neck down 48 hours prior to surgery just in case you cut yourself which could leave a site for infection.  Also, freshly shaved skin may become irritated if using the CHG soap.  Contact lenses, hearing aids and dentures may not be worn into surgery.  Do not bring valuables to the hospital. [facility] is not responsible for any missing/lost belongings or valuables.   Use CHG Soap as directed on instruction sheet.  Notify your doctor if there is any change in your medical condition (cold, fever, infection).  Wear comfortable clothing (specific to your surgery type) to the hospital.  After surgery, you can help prevent lung complications by doing breathing exercises.  Take deep breaths and cough every 1-2 hours. Your doctor may order a device called an Incentive Spirometer to help you take deep breaths. When coughing or sneezing, hold a pillow firmly against your incision with both hands. This is called "splinting." Doing this helps protect your incision. It also decreases belly discomfort.  If you are being discharged the day of surgery, you will not be allowed to drive home. You will need a responsible adult (18 years or older) to drive you home and stay with you that night.   If you are taking public  transportation, you will need to have a responsible adult (18 years or older) with you. Please confirm with your physician that it is acceptable to use public transportation.   Please call the [place]. at [phone] if you have any questions about these instructions.  Surgery Visitation Policy:  Patients undergoing a surgery or procedure may have one family member or support person with them as long as that  person is not COVID-19 positive or experiencing its symptoms.  That person may remain in the waiting area during the procedure.  Inpatient Visitation:    Visiting hours are 7 a.m. to 8 p.m. Inpatients will be allowed two visitors daily. The visitors may change each day during the patient's stay. No visitors under the age of 71. Any visitor under the age of 84 must be accompanied by an adult. The visitor must pass COVID-19 screenings, use hand sanitizer when entering and exiting the patient's room and wear a mask at all times, including in the patient's room. Patients must also wear a mask when staff or their visitor are in the room. Masking is required regardless of vaccination status.

## 2020-08-05 ENCOUNTER — Encounter: Payer: Self-pay | Admitting: Surgery

## 2020-08-06 ENCOUNTER — Other Ambulatory Visit: Payer: Self-pay

## 2020-08-06 ENCOUNTER — Encounter: Payer: Self-pay | Admitting: Surgery

## 2020-08-06 ENCOUNTER — Encounter
Admission: RE | Admit: 2020-08-06 | Discharge: 2020-08-06 | Disposition: A | Discharge status: Home / Self Care | Payer: 59 | Source: Ambulatory Visit | Attending: Surgery | Admitting: Surgery

## 2020-08-06 DIAGNOSIS — Z01812 Encounter for preprocedural laboratory examination: Secondary | ICD-10-CM | POA: Insufficient documentation

## 2020-08-06 LAB — CBC WITH DIFFERENTIAL/PLATELET
Abs Immature Granulocytes: 0.03 10*3/uL (ref 0.00–0.07)
Basophils Absolute: 0.1 10*3/uL (ref 0.0–0.1)
Basophils Relative: 1 %
Eosinophils Absolute: 0.1 10*3/uL (ref 0.0–0.5)
Eosinophils Relative: 1 %
HCT: 43 % (ref 39.0–52.0)
Hemoglobin: 14.2 g/dL (ref 13.0–17.0)
Immature Granulocytes: 0 %
Lymphocytes Relative: 26 %
Lymphs Abs: 2.3 10*3/uL (ref 0.7–4.0)
MCH: 28.6 pg (ref 26.0–34.0)
MCHC: 33 g/dL (ref 30.0–36.0)
MCV: 86.7 fL (ref 80.0–100.0)
Monocytes Absolute: 0.8 10*3/uL (ref 0.1–1.0)
Monocytes Relative: 9 %
Neutro Abs: 5.6 10*3/uL (ref 1.7–7.7)
Neutrophils Relative %: 63 %
Platelets: 255 10*3/uL (ref 150–400)
RBC: 4.96 MIL/uL (ref 4.22–5.81)
RDW: 14.3 % (ref 11.5–15.5)
WBC: 8.9 10*3/uL (ref 4.0–10.5)
nRBC: 0 % (ref 0.0–0.2)

## 2020-08-06 LAB — COMPREHENSIVE METABOLIC PANEL
ALT: 22 U/L (ref 0–44)
AST: 21 U/L (ref 15–41)
Albumin: 4.3 g/dL (ref 3.5–5.0)
Alkaline Phosphatase: 50 U/L (ref 38–126)
Anion gap: 9 (ref 5–15)
BUN: 29 mg/dL — ABNORMAL HIGH (ref 6–20)
CO2: 28 mmol/L (ref 22–32)
Calcium: 9.1 mg/dL (ref 8.9–10.3)
Chloride: 98 mmol/L (ref 98–111)
Creatinine, Ser: 1.4 mg/dL — ABNORMAL HIGH (ref 0.61–1.24)
GFR, Estimated: 59 mL/min — ABNORMAL LOW (ref >60)
Glucose, Bld: 136 mg/dL — ABNORMAL HIGH (ref 70–99)
Potassium: 3.9 mmol/L (ref 3.5–5.1)
Sodium: 135 mmol/L (ref 135–145)
Total Bilirubin: 0.8 mg/dL (ref 0.3–1.2)
Total Protein: 7.5 g/dL (ref 6.5–8.1)

## 2020-08-13 ENCOUNTER — Ambulatory Visit: Payer: 59 | Admitting: Urgent Care

## 2020-08-13 ENCOUNTER — Encounter: Payer: Self-pay | Admitting: Surgery

## 2020-08-13 ENCOUNTER — Other Ambulatory Visit: Payer: Self-pay

## 2020-08-13 ENCOUNTER — Ambulatory Visit
Admission: RE | Admit: 2020-08-13 | Discharge: 2020-08-13 | Disposition: A | Discharge status: Home / Self Care | Payer: 59 | Attending: Surgery | Admitting: Surgery

## 2020-08-13 ENCOUNTER — Encounter
Admission: RE | Disposition: A | Discharge status: Home / Self Care | Payer: Self-pay | Source: Home / Self Care | Attending: Surgery

## 2020-08-13 DIAGNOSIS — G4733 Obstructive sleep apnea (adult) (pediatric): Secondary | ICD-10-CM | POA: Diagnosis not present

## 2020-08-13 DIAGNOSIS — E785 Hyperlipidemia, unspecified: Secondary | ICD-10-CM | POA: Insufficient documentation

## 2020-08-13 DIAGNOSIS — E669 Obesity, unspecified: Secondary | ICD-10-CM | POA: Insufficient documentation

## 2020-08-13 DIAGNOSIS — K429 Umbilical hernia without obstruction or gangrene: Secondary | ICD-10-CM | POA: Diagnosis not present

## 2020-08-13 DIAGNOSIS — N182 Chronic kidney disease, stage 2 (mild): Secondary | ICD-10-CM | POA: Diagnosis not present

## 2020-08-13 DIAGNOSIS — Z7984 Long term (current) use of oral hypoglycemic drugs: Secondary | ICD-10-CM | POA: Diagnosis not present

## 2020-08-13 DIAGNOSIS — I129 Hypertensive chronic kidney disease with stage 1 through stage 4 chronic kidney disease, or unspecified chronic kidney disease: Secondary | ICD-10-CM | POA: Diagnosis not present

## 2020-08-13 DIAGNOSIS — Z8249 Family history of ischemic heart disease and other diseases of the circulatory system: Secondary | ICD-10-CM | POA: Insufficient documentation

## 2020-08-13 DIAGNOSIS — Z6837 Body mass index (BMI) 37.0-37.9, adult: Secondary | ICD-10-CM | POA: Diagnosis not present

## 2020-08-13 DIAGNOSIS — Z79899 Other long term (current) drug therapy: Secondary | ICD-10-CM | POA: Insufficient documentation

## 2020-08-13 DIAGNOSIS — Z87891 Personal history of nicotine dependence: Secondary | ICD-10-CM | POA: Diagnosis not present

## 2020-08-13 HISTORY — DX: Type 2 diabetes mellitus without complications: E11.9

## 2020-08-13 HISTORY — DX: Obstructive sleep apnea (adult) (pediatric): G47.33

## 2020-08-13 HISTORY — DX: Dyspnea, unspecified: R06.00

## 2020-08-13 HISTORY — DX: Other forms of dyspnea: R06.09

## 2020-08-13 HISTORY — PX: UMBILICAL HERNIA REPAIR: SHX196

## 2020-08-13 LAB — GLUCOSE, CAPILLARY
Glucose-Capillary: 109 mg/dL — ABNORMAL HIGH (ref 70–99)
Glucose-Capillary: 129 mg/dL — ABNORMAL HIGH (ref 70–99)

## 2020-08-13 SURGERY — REPAIR, HERNIA, UMBILICAL, ADULT
Anesthesia: General

## 2020-08-13 MED ORDER — CHLORHEXIDINE GLUCONATE CLOTH 2 % EX PADS: 6.0000 | MEDICATED_PAD | Freq: Once | CUTANEOUS | Status: DC

## 2020-08-13 MED ORDER — ROCURONIUM BROMIDE 100 MG/10ML IV SOLN
INTRAVENOUS | Status: DC | PRN
  Administered 2020-08-13: 50 mg via INTRAVENOUS

## 2020-08-13 MED ORDER — ONDANSETRON HCL 4 MG/2ML IJ SOLN
INTRAMUSCULAR | Status: AC
  Filled 2020-08-13: qty 2

## 2020-08-13 MED ORDER — CHLORHEXIDINE GLUCONATE 0.12 % MT SOLN
OROMUCOSAL | Status: AC
  Administered 2020-08-13: 15 mL via OROMUCOSAL
  Filled 2020-08-13: qty 15

## 2020-08-13 MED ORDER — KETAMINE HCL 10 MG/ML IJ SOLN
INTRAMUSCULAR | Status: DC | PRN
  Administered 2020-08-13: 25 mg via INTRAVENOUS

## 2020-08-13 MED ORDER — LIDOCAINE HCL (CARDIAC) PF 100 MG/5ML IV SOSY
PREFILLED_SYRINGE | INTRAVENOUS | Status: DC | PRN
  Administered 2020-08-13: 50 mg via INTRAVENOUS

## 2020-08-13 MED ORDER — CHLORHEXIDINE GLUCONATE 0.12 % MT SOLN: 15.0000 mL | Freq: Once | OROMUCOSAL | Status: AC

## 2020-08-13 MED ORDER — GABAPENTIN 300 MG PO CAPS: 300.0000 mg | ORAL_CAPSULE | ORAL | Status: AC

## 2020-08-13 MED ORDER — SUGAMMADEX SODIUM 500 MG/5ML IV SOLN
INTRAVENOUS | Status: AC
  Filled 2020-08-13: qty 5

## 2020-08-13 MED ORDER — ORAL CARE MOUTH RINSE: 15.0000 mL | Freq: Once | OROMUCOSAL | Status: AC

## 2020-08-13 MED ORDER — MIDAZOLAM HCL 2 MG/2ML IJ SOLN
INTRAMUSCULAR | Status: DC | PRN
  Administered 2020-08-13: 2 mg via INTRAVENOUS

## 2020-08-13 MED ORDER — MIDAZOLAM HCL 2 MG/2ML IJ SOLN
INTRAMUSCULAR | Status: AC
  Filled 2020-08-13: qty 2

## 2020-08-13 MED ORDER — CELECOXIB 200 MG PO CAPS: 200.0000 mg | ORAL_CAPSULE | ORAL | Status: AC

## 2020-08-13 MED ORDER — BUPIVACAINE-EPINEPHRINE (PF) 0.25% -1:200000 IJ SOLN
INTRAMUSCULAR | Status: AC
  Filled 2020-08-13: qty 30

## 2020-08-13 MED ORDER — HYDROCODONE-ACETAMINOPHEN 7.5-325 MG PO TABS
1.0000 | ORAL_TABLET | Freq: Once | ORAL | Status: AC | PRN
Start: 2020-08-13 — End: 2020-08-13

## 2020-08-13 MED ORDER — LIDOCAINE HCL (PF) 2 % IJ SOLN
INTRAMUSCULAR | Status: AC
  Filled 2020-08-13: qty 5

## 2020-08-13 MED ORDER — FENTANYL CITRATE (PF) 100 MCG/2ML IJ SOLN
INTRAMUSCULAR | Status: DC | PRN
  Administered 2020-08-13: 100 ug via INTRAVENOUS

## 2020-08-13 MED ORDER — ROCURONIUM BROMIDE 10 MG/ML (PF) SYRINGE
PREFILLED_SYRINGE | INTRAVENOUS | Status: AC
  Filled 2020-08-13: qty 10

## 2020-08-13 MED ORDER — DEXAMETHASONE SODIUM PHOSPHATE 10 MG/ML IJ SOLN
INTRAMUSCULAR | Status: AC
  Filled 2020-08-13: qty 1

## 2020-08-13 MED ORDER — HYDROCODONE-ACETAMINOPHEN 7.5-325 MG PO TABS
ORAL_TABLET | ORAL | Status: AC
  Administered 2020-08-13: 1 via ORAL
  Filled 2020-08-13: qty 1

## 2020-08-13 MED ORDER — IBUPROFEN 800 MG PO TABS: 800.0000 mg | ORAL_TABLET | Freq: Three times a day (TID) | ORAL | 0 refills | Status: AC | PRN

## 2020-08-13 MED ORDER — ACETAMINOPHEN 500 MG PO TABS: 1000.0000 mg | ORAL_TABLET | ORAL | Status: AC

## 2020-08-13 MED ORDER — FENTANYL CITRATE (PF) 100 MCG/2ML IJ SOLN
INTRAMUSCULAR | Status: AC
  Filled 2020-08-13: qty 2

## 2020-08-13 MED ORDER — DEXMEDETOMIDINE (PRECEDEX) IN NS 20 MCG/5ML (4 MCG/ML) IV SYRINGE
PREFILLED_SYRINGE | INTRAVENOUS | Status: DC | PRN
  Administered 2020-08-13: 10 ug via INTRAVENOUS

## 2020-08-13 MED ORDER — PROPOFOL 10 MG/ML IV BOLUS
INTRAVENOUS | Status: DC | PRN
  Administered 2020-08-13: 150 mg via INTRAVENOUS

## 2020-08-13 MED ORDER — FENTANYL CITRATE (PF) 100 MCG/2ML IJ SOLN: 25.0000 ug | INTRAMUSCULAR | Status: DC | PRN

## 2020-08-13 MED ORDER — BUPIVACAINE-EPINEPHRINE 0.25% -1:200000 IJ SOLN
INTRAMUSCULAR | Status: DC | PRN
  Administered 2020-08-13: 30 mL

## 2020-08-13 MED ORDER — HYDROCODONE-ACETAMINOPHEN 5-325 MG PO TABS: 1.0000 | ORAL_TABLET | Freq: Four times a day (QID) | ORAL | 0 refills | Status: DC | PRN

## 2020-08-13 MED ORDER — SODIUM CHLORIDE 0.9 % IV SOLN: INTRAVENOUS | Status: DC

## 2020-08-13 MED ORDER — BUPIVACAINE LIPOSOME 1.3 % IJ SUSP: 20.0000 mL | Freq: Once | INTRAMUSCULAR | Status: DC

## 2020-08-13 MED ORDER — CEFAZOLIN IN SODIUM CHLORIDE 3-0.9 GM/100ML-% IV SOLN
3.0000 g | INTRAVENOUS | Status: AC
Start: 2020-08-13 — End: 2020-08-13
  Administered 2020-08-13: 3 g via INTRAVENOUS
  Filled 2020-08-13: qty 100

## 2020-08-13 MED ORDER — ACETAMINOPHEN 500 MG PO TABS
ORAL_TABLET | ORAL | Status: AC
  Administered 2020-08-13: 1000 mg via ORAL
  Filled 2020-08-13: qty 2

## 2020-08-13 MED ORDER — FENTANYL CITRATE (PF) 100 MCG/2ML IJ SOLN
INTRAMUSCULAR | Status: AC
  Administered 2020-08-13: 25 ug via INTRAVENOUS
  Filled 2020-08-13: qty 2

## 2020-08-13 MED ORDER — ONDANSETRON HCL 4 MG/2ML IJ SOLN
INTRAMUSCULAR | Status: DC | PRN
  Administered 2020-08-13: 4 mg via INTRAVENOUS

## 2020-08-13 MED ORDER — SUGAMMADEX SODIUM 200 MG/2ML IV SOLN
INTRAVENOUS | Status: DC | PRN
  Administered 2020-08-13: 200 mg via INTRAVENOUS

## 2020-08-13 MED ORDER — DEXAMETHASONE SODIUM PHOSPHATE 10 MG/ML IJ SOLN
INTRAMUSCULAR | Status: DC | PRN
Start: 2020-08-13 — End: 2020-08-13
  Administered 2020-08-13: 5 mg via INTRAVENOUS

## 2020-08-13 MED ORDER — GABAPENTIN 300 MG PO CAPS
ORAL_CAPSULE | ORAL | Status: AC
  Administered 2020-08-13: 300 mg via ORAL
  Filled 2020-08-13: qty 1

## 2020-08-13 MED ORDER — CELECOXIB 200 MG PO CAPS
ORAL_CAPSULE | ORAL | Status: AC
  Administered 2020-08-13: 200 mg via ORAL
  Filled 2020-08-13: qty 1

## 2020-08-13 SURGICAL SUPPLY — 31 items
ADH SKN CLS APL DERMABOND .7 (GAUZE/BANDAGES/DRESSINGS) ×1
APL PRP STRL LF DISP 70% ISPRP (MISCELLANEOUS) ×1
BINDER ABDOMINAL 12 ML 46-62 (SOFTGOODS) ×1 IMPLANT
BLADE CLIPPER SURG (BLADE) ×1 IMPLANT
BLADE SURG 15 STRL LF DISP TIS (BLADE) ×1 IMPLANT
BLADE SURG 15 STRL SS (BLADE) ×2
CANISTER SUCT 1200ML W/VALVE (MISCELLANEOUS) ×1 IMPLANT
CHLORAPREP W/TINT 26 (MISCELLANEOUS) ×2 IMPLANT
DECANTER SPIKE VIAL GLASS SM (MISCELLANEOUS) ×2 IMPLANT
DERMABOND ADVANCED (GAUZE/BANDAGES/DRESSINGS) ×1
DERMABOND ADVANCED .7 DNX12 (GAUZE/BANDAGES/DRESSINGS) ×1 IMPLANT
DRAPE LAPAROTOMY 77X122 PED (DRAPES) ×2 IMPLANT
ELECT CAUTERY BLADE 6.4 (BLADE) ×2 IMPLANT
ELECT REM PT RETURN 9FT ADLT (ELECTROSURGICAL) ×2
ELECTRODE REM PT RTRN 9FT ADLT (ELECTROSURGICAL) ×1 IMPLANT
GAUZE 4X4 16PLY ~~LOC~~+RFID DBL (SPONGE) ×2 IMPLANT
GLOVE SURG ORTHO LTX SZ7.5 (GLOVE) ×4 IMPLANT
GOWN STRL REUS W/ TWL LRG LVL3 (GOWN DISPOSABLE) ×2 IMPLANT
GOWN STRL REUS W/TWL LRG LVL3 (GOWN DISPOSABLE) ×4
KIT TURNOVER KIT A (KITS) ×2 IMPLANT
MANIFOLD NEPTUNE II (INSTRUMENTS) ×2 IMPLANT
NEEDLE HYPO 22GX1.5 SAFETY (NEEDLE) ×2 IMPLANT
NS IRRIG 500ML POUR BTL (IV SOLUTION) ×2 IMPLANT
PACK BASIN MINOR ARMC (MISCELLANEOUS) ×2 IMPLANT
SUT ETHIBOND 0 MO6 C/R (SUTURE) ×2 IMPLANT
SUT MNCRL 4-0 (SUTURE) ×2
SUT MNCRL 4-0 27XMFL (SUTURE) ×1
SUT VIC AB 3-0 SH 27 (SUTURE) ×2
SUT VIC AB 3-0 SH 27X BRD (SUTURE) ×1 IMPLANT
SUTURE MNCRL 4-0 27XMF (SUTURE) ×1 IMPLANT
SYR 10ML LL (SYRINGE) ×2 IMPLANT

## 2020-08-13 NOTE — Op Note (Signed)
Umbilical Hernia Repair  Pre-operative Diagnosis: Umbilical hernia  Post-operative Diagnosis: same  Surgeon: Ronny Bacon, MD FACS  Anesthesia: General   Findings: 1.5 cm fascial defect diameter    Estimated Blood Loss: 5 mL                 Specimens: None.         Complications: none              Procedure Details  The patient was seen again in the Holding Room. The benefits, complications, treatment options, and expected outcomes were discussed with the patient. The risks of bleeding, infection, recurrence of symptoms, failure to resolve symptoms, bowel injury, mesh placement, mesh infection, any of which could require further surgery were reviewed with the patient. The likelihood of improving the patient's symptoms with return to their baseline status is good.  The patient and/or family concurred with the proposed plan, giving informed consent.  The patient was taken to Operating Room, identified, and the procedure verified.  A Time Out was held and the above information confirmed.  Prior to the induction of general anesthesia, antibiotic prophylaxis was administered. VTE prophylaxis was in place. General endotracheal anesthesia was then administered and tolerated well. After the induction, the abdomen was prepped with Chloraprep and draped in the sterile fashion. The patient was positioned in the supine position. Marcaine quarter percent with epinephrine and lidocaine 1% was used to inject all the incision site. Incision was created with a scalpel over the hernia defect. Electrocautery was used to dissect through subcutaneous tissue, the hernia sac was dissected to the fascial defect, then fully reduced.  I evaluated the preperitoneal space adjacent to the fascia. I closed the hernia defect with interrupted 0 Ethibond sutures, and a vest overpants fashion.  Additional local anesthetic was infiltrated into the fascial plane. Incision was closed in a 2 layer fashion with 3-0 Vicryl  and 4-0 Monocryl. Dermabond was used to coat the skin. Patient tolerated procedure well and there were no immediate complications. Needle and laparotomy counts were correct   Ronny Bacon, M.D., [facility]  08/13/2020 ; 11:36 AM

## 2020-08-13 NOTE — Discharge Instructions (Signed)
AMBULATORY SURGERY  ?DISCHARGE INSTRUCTIONS ? ? ?The drugs that you were given will stay in your system until tomorrow so for the next 24 hours you should not: ? ?Drive an automobile ?Make any legal decisions ?Drink any alcoholic beverage ? ? ?You may resume regular meals tomorrow.  Today it is better to start with liquids and gradually work up to solid foods. ? ?You may eat anything you prefer, but it is better to start with liquids, then soup and crackers, and gradually work up to solid foods. ? ? ?Please notify your doctor immediately if you have any unusual bleeding, trouble breathing, redness and pain at the surgery site, drainage, fever, or pain not relieved by medication. ? ? ? ?Additional Instructions: ? ? ? ?Please contact your physician with any problems or Same Day Surgery at [phone], Monday through Friday 6 am to 4 pm, or [facility] Main number at [phone].  ?
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
## 2020-08-13 NOTE — Anesthesia Procedure Notes (Signed)
Procedure Name: Intubation Date/Time: 08/13/2020 10:48 AM Performed by: Biagio Borg, CRNA Pre-anesthesia Checklist: Patient identified, Emergency Drugs available, Suction available and Patient being monitored Patient Re-evaluated:Patient Re-evaluated prior to induction Oxygen Delivery Method: Circle system utilized Preoxygenation: Pre-oxygenation with 100% oxygen Induction Type: IV induction Ventilation: Mask ventilation without difficulty Laryngoscope Size: McGraph and 4 Grade View: Grade II Tube type: Oral Tube size: 7.0 mm Number of attempts: 1 Airway Equipment and Method: Stylet Placement Confirmation: ETT inserted through vocal cords under direct vision, positive ETCO2 and breath sounds checked- equal and bilateral Secured at: 25 cm Tube secured with: Tape Dental Injury: Teeth and Oropharynx as per pre-operative assessment

## 2020-08-13 NOTE — Anesthesia Postprocedure Evaluation (Signed)
Anesthesia Post Note  Patient: Andrew Mata  Procedure(s) Performed: HERNIA REPAIR UMBILICAL ADULT  Patient location during evaluation: PACU Anesthesia Type: General Level of consciousness: awake and alert Pain management: pain level controlled Vital Signs Assessment: post-procedure vital signs reviewed and stable Respiratory status: spontaneous breathing, nonlabored ventilation, respiratory function stable and patient connected to nasal cannula oxygen Cardiovascular status: blood pressure returned to baseline and stable Postop Assessment: no apparent nausea or vomiting Anesthetic complications: no   No notable events documented.   Last Vitals:  Vitals:   08/13/20 1200 08/13/20 1224  BP: 121/74 120/77  Pulse: 89 98  Resp: 17 18  Temp: (!) 36.3 C (!) 36.1 C  SpO2: 96% 100%    Last Pain:  Vitals:   08/13/20 1224  TempSrc: Temporal  PainSc: 3                  Margaree Mackintosh

## 2020-08-13 NOTE — Transfer of Care (Signed)
Immediate Anesthesia Transfer of Care Note  Patient: [name]  Procedure(s) Performed: HERNIA REPAIR UMBILICAL ADULT  Patient Location: PACU  Anesthesia Type:General  Level of Consciousness: drowsy  Airway & Oxygen Therapy: Patient Spontanous Breathing and Patient connected to face mask oxygen  Post-op Assessment: Report given to RN and Post -op Vital signs reviewed and stable  Post vital signs: Reviewed and stable  Last Vitals:  Vitals Value Taken Time  BP 125/80 08/13/20 1132  Temp    Pulse 91 08/13/20 1135  Resp 15 08/13/20 1135  SpO2 97 % 08/13/20 1135  Vitals shown include unvalidated device data.  Last Pain:  Vitals:   08/13/20 0955  TempSrc: Temporal  PainSc: 0-No pain         Complications: No notable events documented.

## 2020-08-13 NOTE — Interval H&P Note (Signed)
History and Physical Interval Note:  08/13/2020 10:30 AM  Andrew Mata  has presented today for surgery, with the diagnosis of umbilical hernia.  The various methods of treatment have been discussed with the patient and family. After consideration of risks, benefits and other options for treatment, the patient has consented to  Procedure(s): HERNIA REPAIR UMBILICAL ADULT (N/A) as a surgical intervention.  The patient's history has been reviewed, patient examined, no change in status, stable for surgery.  I have reviewed the patient's chart and labs.  Questions were answered to the patient's satisfaction.     Ronny Bacon

## 2020-08-26 ENCOUNTER — Other Ambulatory Visit: Payer: Self-pay

## 2020-08-26 ENCOUNTER — Encounter: Payer: Self-pay | Admitting: Surgery

## 2020-08-26 ENCOUNTER — Ambulatory Visit (INDEPENDENT_AMBULATORY_CARE_PROVIDER_SITE_OTHER): Payer: 59 | Admitting: Surgery

## 2020-08-26 VITALS — BP 140/86 | HR 96 | Temp 98.2°F | Ht 71.0 in | Wt 278.0 lb

## 2020-08-26 DIAGNOSIS — Z09 Encounter for follow-up examination after completed treatment for conditions other than malignant neoplasm: Secondary | ICD-10-CM

## 2020-08-26 NOTE — Patient Instructions (Signed)
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
## 2020-09-06 ENCOUNTER — Other Ambulatory Visit
Admission: RE | Admit: 2020-09-06 | Discharge: 2020-09-06 | Disposition: A | Discharge status: Home / Self Care | Payer: 59 | Source: Ambulatory Visit | Attending: Pulmonary Disease | Admitting: Pulmonary Disease

## 2020-09-06 DIAGNOSIS — R0602 Shortness of breath: Secondary | ICD-10-CM | POA: Diagnosis not present

## 2020-09-06 DIAGNOSIS — Z9989 Dependence on other enabling machines and devices: Secondary | ICD-10-CM | POA: Insufficient documentation

## 2020-09-06 LAB — D-DIMER, QUANTITATIVE: D-Dimer, Quant: <0.27 ug/mL-FEU (ref 0.00–0.50)
